# Patient Record
Sex: Female | Born: 2000 | Race: Black or African American | Hispanic: No | Marital: Single | Smoking: Never smoker
Health system: Southern US, Community
[De-identification: ages and names within clinical notes are randomized; demographics above are authoritative.]

## PROBLEM LIST (undated history)

## (undated) DIAGNOSIS — Z789 Other specified health status: Secondary | ICD-10-CM

## (undated) DIAGNOSIS — J45909 Unspecified asthma, uncomplicated: Secondary | ICD-10-CM

## (undated) HISTORY — DX: Other specified health status: Z78.9

## (undated) HISTORY — PX: NO PAST SURGERIES: SHX2092

---

## 2020-10-09 NOTE — L&D Delivery Note (Signed)
Operative Delivery Note  Indication for operative vaginal delivery: Prolonged fetal heart rate deceleration, maternal exhaustion in second stage of labor  Patient was examined and found to be fully dilated with fetal station of +2.  Patient's bladder was noted to be empty, and there were no known fetal contraindications to operative vaginal delivery. EFW was 8 lbs by Le[place]olds.  Risks of vacuum assistance were discussed in detail, including but not limited to, bleeding, infection, damage to maternal tissues, fetal cephalohematoma, inability to effect vaginal delivery of the head or shoulder dystocia that cannot be resolved by established maneuvers and need for emergency cesarean section.  Patient gave verbal consent.  The soft vacuum cup was positioned over the sagittal suture 3 cm anterior to posterior fontanelle.  Pressure was then increased to 500 mmHg, and the patient was instructed to push.  Pulling was administered along the pelvic curve while patient was pushing; there were 2 contractions and no popoffs.  Vacuum was reduced in between contractions.  The infant's head was then delivered  and two nuchal cords were noted and reduced. Shoulder dystocia was noted, this was alleviated with McRoberts' positioning and suprapubic pressure, but lasted for 30 seconds.  Female infant was delivered, noted to be without tone, cord immediately clamped and cut and he was handed to awaiting Neonatology team for resuscitation.  Apgars were 0, 7, 9; arterial cord pH was 7.12.  There was spontaneous placental delivery, intact with three-vessel cord.  Second degree perineal laceration noted requiring repair with 3-0 Vicryl in the usual fashion.  EBL 500 ml, from atony, Methergine x 1 dose and TXA given.  She had epidural anesthesia.   Sponge, instrument and needle counts were correct x 3.  Baby was taken to NICU. The patient was stable after delivery with plans to transfer later to postpartum unit    Jaynie Collins, MD,  FACOG Obstetrician & Gynecologist, [facility] for [employer], [facility]

## 2021-02-14 ENCOUNTER — Other Ambulatory Visit: Payer: Self-pay

## 2021-02-14 ENCOUNTER — Inpatient Hospital Stay (HOSPITAL_COMMUNITY)
Admission: AD | Admit: 2021-02-14 | Discharge: 2021-02-14 | Disposition: A | Discharge status: Home / Self Care | Payer: Medicaid Other | Attending: Family Medicine | Admitting: Family Medicine

## 2021-02-14 ENCOUNTER — Encounter (HOSPITAL_COMMUNITY): Payer: Self-pay | Admitting: Family Medicine

## 2021-02-14 ENCOUNTER — Inpatient Hospital Stay (HOSPITAL_COMMUNITY): Payer: Medicaid Other

## 2021-02-14 DIAGNOSIS — R109 Unspecified abdominal pain: Secondary | ICD-10-CM

## 2021-02-14 DIAGNOSIS — E876 Hypokalemia: Secondary | ICD-10-CM | POA: Insufficient documentation

## 2021-02-14 DIAGNOSIS — R1030 Lower abdominal pain, unspecified: Secondary | ICD-10-CM | POA: Insufficient documentation

## 2021-02-14 DIAGNOSIS — O99891 Other specified diseases and conditions complicating pregnancy: Secondary | ICD-10-CM

## 2021-02-14 DIAGNOSIS — O26891 Other specified pregnancy related conditions, first trimester: Secondary | ICD-10-CM | POA: Insufficient documentation

## 2021-02-14 DIAGNOSIS — O99281 Endocrine, nutritional and metabolic diseases complicating pregnancy, first trimester: Secondary | ICD-10-CM | POA: Diagnosis not present

## 2021-02-14 DIAGNOSIS — Z3A10 10 weeks gestation of pregnancy: Secondary | ICD-10-CM | POA: Diagnosis not present

## 2021-02-14 DIAGNOSIS — Z3490 Encounter for supervision of normal pregnancy, unspecified, unspecified trimester: Secondary | ICD-10-CM

## 2021-02-14 HISTORY — DX: Unspecified asthma, uncomplicated: J45.909

## 2021-02-14 LAB — COMPREHENSIVE METABOLIC PANEL
ALT: 9 U/L (ref 0–44)
AST: 14 U/L — ABNORMAL LOW (ref 15–41)
Albumin: 3.9 g/dL (ref 3.5–5.0)
Alkaline Phosphatase: 46 U/L (ref 38–126)
Anion gap: 6 (ref 5–15)
BUN: <5 mg/dL — ABNORMAL LOW (ref 6–20)
CO2: 26 mmol/L (ref 22–32)
Calcium: 9.3 mg/dL (ref 8.9–10.3)
Chloride: 101 mmol/L (ref 98–111)
Creatinine, Ser: 0.59 mg/dL (ref 0.44–1.00)
GFR, Estimated: >60 mL/min (ref >60)
Glucose, Bld: 89 mg/dL (ref 70–99)
Potassium: 2.8 mmol/L — ABNORMAL LOW (ref 3.5–5.1)
Sodium: 133 mmol/L — ABNORMAL LOW (ref 135–145)
Total Bilirubin: 0.6 mg/dL (ref 0.3–1.2)
Total Protein: 8.1 g/dL (ref 6.5–8.1)

## 2021-02-14 LAB — URINALYSIS, ROUTINE W REFLEX MICROSCOPIC
Bilirubin Urine: NEGATIVE
Glucose, UA: NEGATIVE mg/dL
Hgb urine dipstick: NEGATIVE
Ketones, ur: 20 mg/dL — AB
Leukocytes,Ua: NEGATIVE
Nitrite: NEGATIVE
Protein, ur: NEGATIVE mg/dL
Specific Gravity, Urine: 1.006 (ref 1.005–1.030)
pH: 7 (ref 5.0–8.0)

## 2021-02-14 LAB — CBC
HCT: 36.6 % (ref 36.0–46.0)
Hemoglobin: 12 g/dL (ref 12.0–15.0)
MCH: 30.8 pg (ref 26.0–34.0)
MCHC: 32.8 g/dL (ref 30.0–36.0)
MCV: 94.1 fL (ref 80.0–100.0)
Platelets: 308 10*3/uL (ref 150–400)
RBC: 3.89 MIL/uL (ref 3.87–5.11)
RDW: 17 % — ABNORMAL HIGH (ref 11.5–15.5)
WBC: 3.8 10*3/uL — ABNORMAL LOW (ref 4.0–10.5)
nRBC: 0 % (ref 0.0–0.2)

## 2021-02-14 LAB — I-STAT BETA HCG BLOOD, ED (MC, WL, AP ONLY): I-stat hCG, quantitative: >2000 m[IU]/mL — ABNORMAL HIGH (ref <5)

## 2021-02-14 LAB — LIPASE, BLOOD: Lipase: 23 U/L (ref 11–51)

## 2021-02-14 LAB — HCG, QUANTITATIVE, PREGNANCY: hCG, Beta Chain, Quant, S: 269665 m[IU]/mL — ABNORMAL HIGH (ref <5)

## 2021-02-14 IMAGING — US US OB COMP LESS 14 WK
1 series · 15 of 28 positions shown · non-contrast
Comparison: None.

CLINICAL DATA: Abdominal pain. Estimated gestational age by last
menstrual period equals 10 weeks 0 days

EXAM:
OBSTETRIC <14 WK ULTRASOUND
TECHNIQUE: Transabdominal ultrasound was performed for evaluation of the
gestation as well as the maternal uterus and adnexal regions.

[Series 1: us ob comp less 14 wk · 15 of 31 slices shown]
[im 1/31]
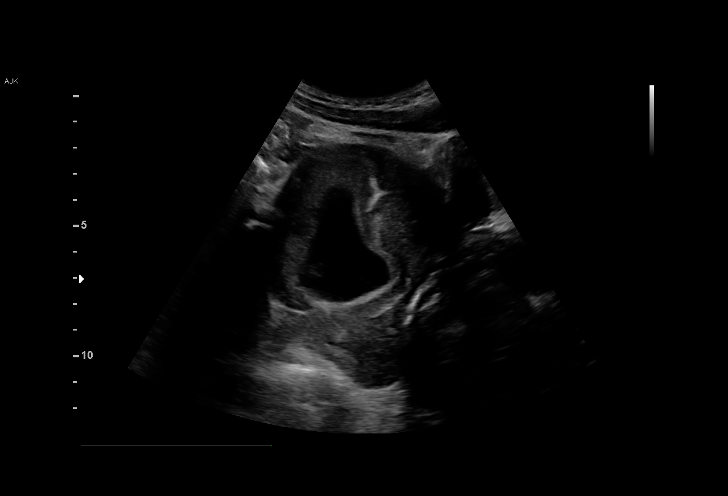
[im 3/31]
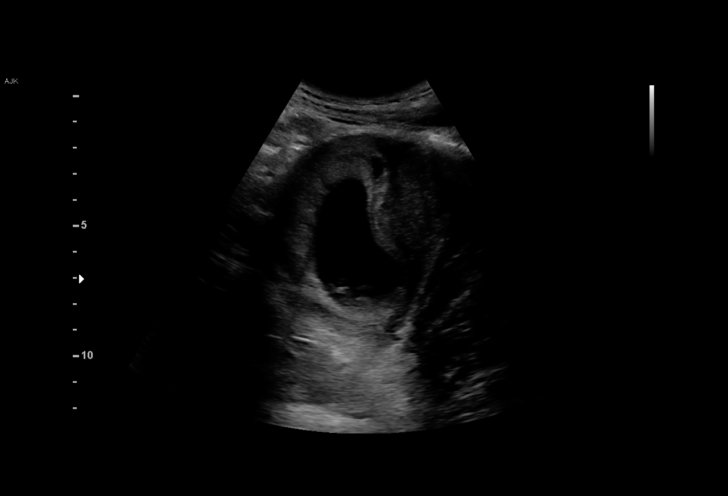
[im 5/31]
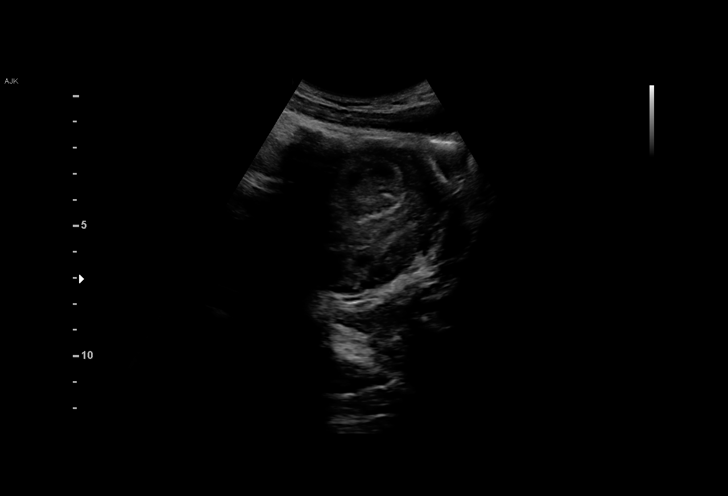
[im 7/31]
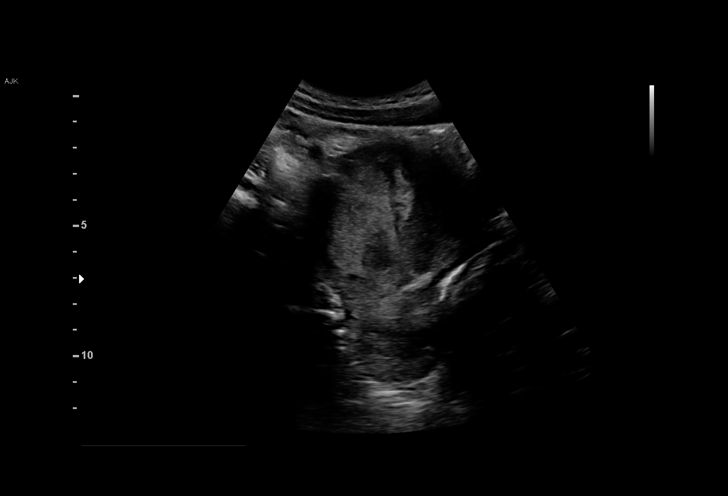
[im 9/31]
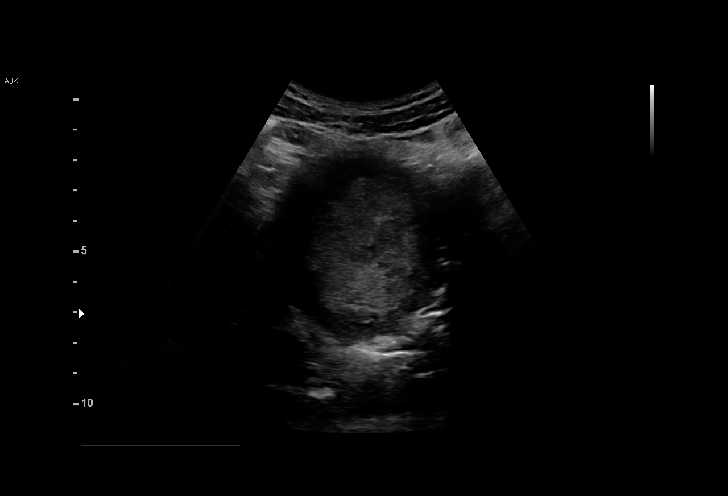
[im 12/31]
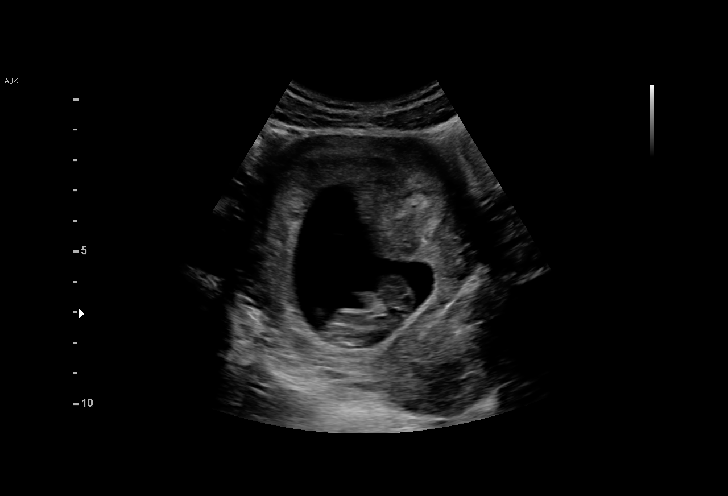
[im 14/31]
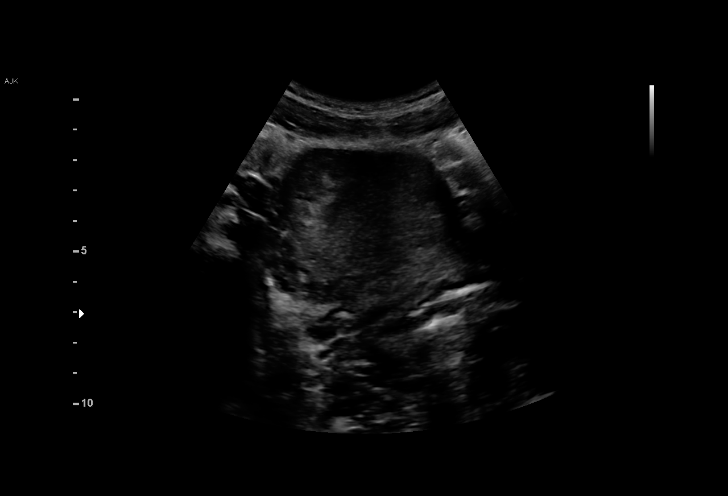
[im 16/31]
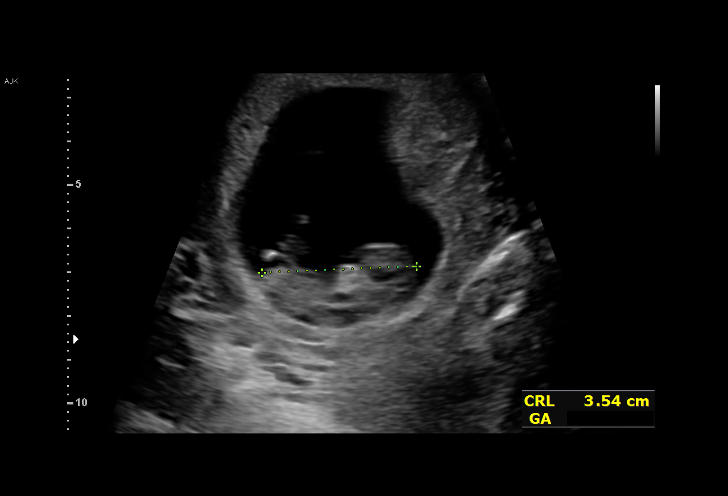
[im 17/31]
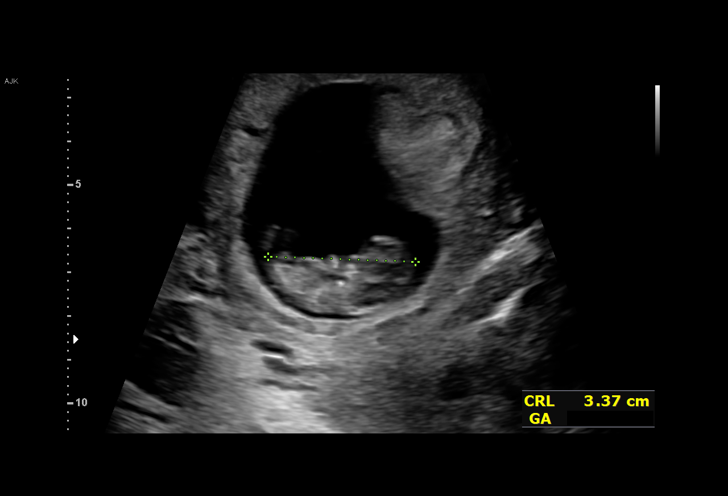
[im 19/31]
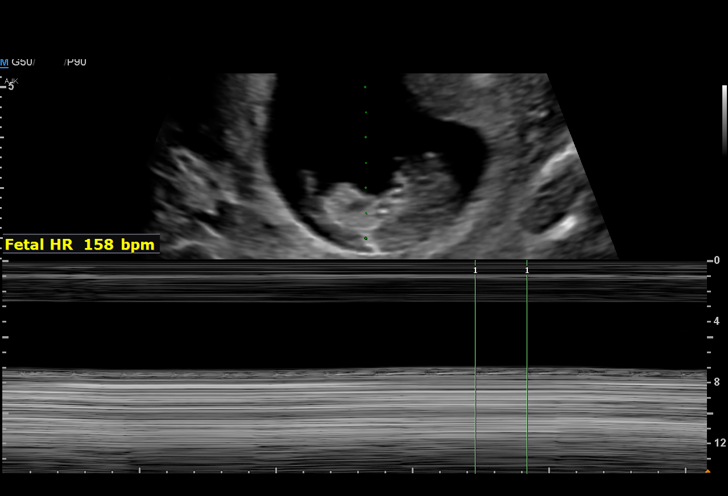
[im 22/31]
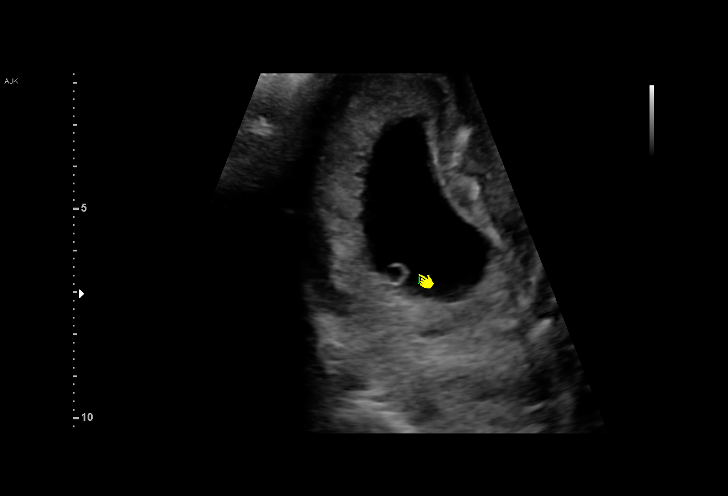
[im 24/31]
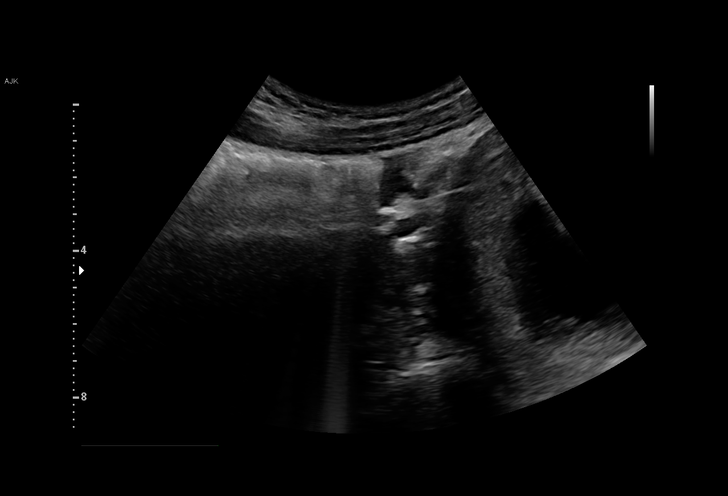
[im 26/31]
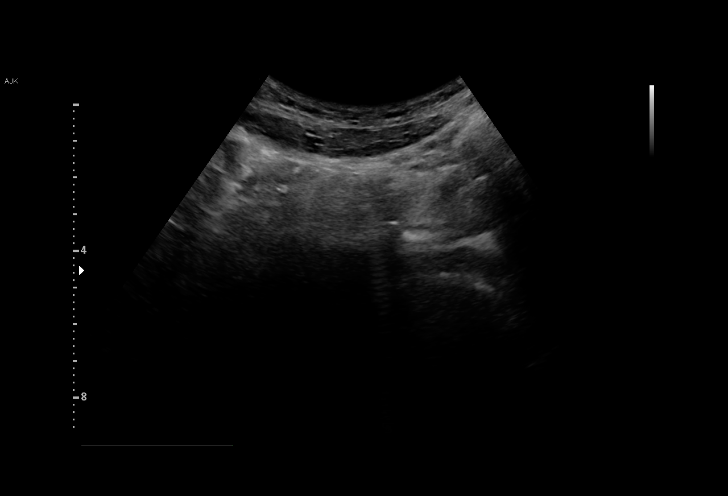
[im 28/31]
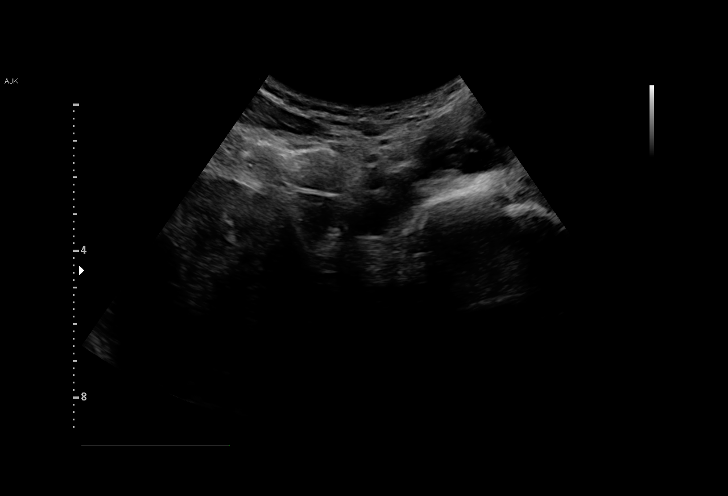
[im 31/31]
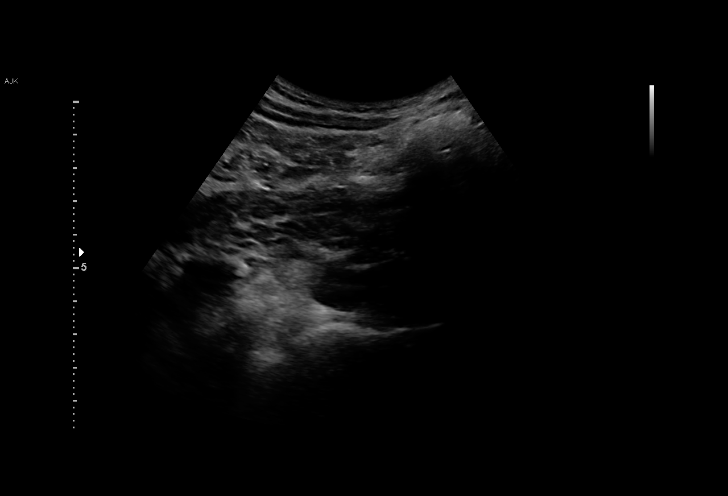

[15 of 28 positions shown; findings below may reference images not displayed]

FINDINGS: Intrauterine gestational sac: Present

Yolk sac:  Present

Embryo:  Present

Cardiac Activity: Present

Heart Rate: 158 bpm

CRL:   33.8 mm   10 w 1 d                  US EDC: [DATE]

Subchorionic hemorrhage:  None visualized.

Maternal uterus/adnexae: Ovaries not well identified on
transabdominal exam. No free fluid
IMPRESSION: 1. Single intrauterine gestation with embryo and normal cardiac
activity.

2. Estimated gestational age by crown rump length equals 10 weeks 1
day.

## 2021-02-14 MED ORDER — POTASSIUM CHLORIDE CRYS ER 20 MEQ PO TBCR
40.0000 meq | EXTENDED_RELEASE_TABLET | Freq: Once | ORAL | Status: AC
  Administered 2021-02-14: 40 meq via ORAL
  Filled 2021-02-14: qty 2

## 2021-02-14 MED ORDER — PYRIDOXINE HCL 25 MG PO TABS: 25.0000 mg | ORAL_TABLET | Freq: Four times a day (QID) | ORAL | 3 refills | Status: DC | PRN

## 2021-02-14 MED ORDER — PREPLUS 27-1 MG PO TABS: 1.0000 | ORAL_TABLET | Freq: Every day | ORAL | 13 refills | Status: AC

## 2021-02-14 MED ORDER — DOXYLAMINE SUCCINATE (SLEEP) 25 MG PO TABS: 25.0000 mg | ORAL_TABLET | Freq: Four times a day (QID) | ORAL | 2 refills | Status: DC | PRN

## 2021-02-14 NOTE — ED Provider Notes (Signed)
Emergency Medicine Provider Triage Evaluation Note  Nichole Bauer , a 20 y.o. female  was evaluated in triage.  Pt complains of lower abd pain/cramping.  No vaginal bleeding.  No discharge.  N/V.  Denies marijuana or alcohol use.  Denies fevers but endorses hot flashes.  Feels fatigued.  Review of Systems  Positive: Lower abd cramping. N/V. Fatigued. Negative: Fevers. Vaginal bleeding/discharge.  Physical Exam  BP (!) 131/101 (BP Location: Right Arm)   Pulse 71   Temp 98.8 F (37.1 C) (Oral)   Resp 16   Ht 4\' 10"  (1.473 m)   Wt 52.2 kg   SpO2 100%   BMI 24.04 kg/m  Gen:   Awake, no distress   Resp:  Normal effort  MSK:   Moves extremities without difficulty    Medical Decision Making  Medically screening exam initiated at 10:48 AM.  Appropriate orders placed.  Nichole Bauer was informed that the remainder of the evaluation will be completed by another provider, this initial triage assessment does not replace that evaluation, and the importance of remaining in the ED until their evaluation is complete.    , PA-C 02/14/21 1050    04/16/21, MD 02/14/21 1254

## 2021-02-14 NOTE — ED Provider Notes (Signed)
Patient was screened earlier in triage, found to have a positive hCG at greater than 2000.  Called to MAU APP who accepts patient in transfer to MAU.  Plan is to give K-Dur p.o. for her potassium of 2.8 prior to transfer.  RN updated with plan of care.   Jeannie Fend, PA-C 02/14/21 1218    Bethann Berkshire, MD 02/14/21 1254

## 2021-02-14 NOTE — MAU Provider Note (Signed)
History     CSN: [id number]  Arrival date and time: 02/14/21 1012   None     Chief Complaint  Patient presents with  . Abdominal Pain   HPI Patient presents as a transfer from the ER for abdominal cramping and a positive pregnancy test. Based on her LMP she would be approximately ten weeks pregnant.   She also was noted to be hypokalemic with a K of 2.8. She was given PO potassium prior to arrival in MAU.   Patient reports vomiting and abdominal cramping for the past month. She has had difficulty tolerating PO, however today she reports that she has zero nausea and feels fine. She has tolerated PO today but just felt that it was important to come get checked out.   She reports abdominal pain comes and goes, at worst is rated 5/10. She denies any vaginal bleeding. No prior history of pregnancy. This pregnancy was not planned, however she thinks she will keep the baby.   No meds, no hx of hospitalization.   Just recently moved from [place]. Staying with sister/two kids. Feels safe at home.  Endorses Marijuana use a few days ago. Alcohol use > one month ago. No other drug use.      OB History    Gravida  1   Para      Term      Preterm      AB      Living        SAB      IAB      Ectopic      Multiple      Live Births              Social History   Tobacco Use  . Smoking status: Never Smoker  . Smokeless tobacco: Never Used  Substance Use Topics  . Alcohol use: Not Currently  . Drug use: Never    Allergies: No Known Allergies  No medications prior to admission.    Review of Systems  Constitutional: Negative for activity change, appetite change and chills.  Respiratory: Negative for shortness of breath.   Cardiovascular: Negative for chest pain.  Gastrointestinal: Positive for abdominal pain. Negative for abdominal distention and constipation.  Genitourinary: Negative for difficulty urinating, dysuria, frequency and vaginal discharge.   Neurological: Negative for dizziness.   Physical Exam   Blood pressure 124/81, pulse 62, temperature 98.7 F (37.1 C), temperature source Oral, resp. rate 15, height 4\' 10"  (1.473 m), weight 52.2 kg, last menstrual period 12/06/2020, SpO2 100 %.  Physical Exam Vitals and nursing note reviewed.  Constitutional:      Appearance: She is well-developed.  HENT:     Head: Normocephalic and atraumatic.  Abdominal:     General: There is no distension.     Palpations: Abdomen is soft.     Tenderness: There is generalized abdominal tenderness.     Comments: Tenderness to palpation of lower quadrants bilaterally, no rebound or guarding   Skin:    General: Skin is warm and dry.  Neurological:     Mental Status: She is alert.  Psychiatric:        Mood and Affect: Mood normal.      MAU Course  Procedures  MDM  -sent from ER, reviewed labs performed in ER -UA, quant HCG, 12/08/2020 ordered -UA normal, HCG pending -US shows viable IUP at [redacted]w[redacted]d  Assessment and Plan   Viable IUP at [redacted]w[redacted]d -script sent for PNV, b6/doxylamine  -  provided with list of OB providers in area -stable for dc home   Gita Kudo 02/14/2021, 2:23 PM

## 2021-02-14 NOTE — MAU Note (Signed)
.  Nichole Bauer is a 20 y.o. at approximately [redacted]w[redacted]d here in MAU reporting: She has been throwing up for the past month. Patient states she has been vomiting for the past month off and on. She states some days she is able to eat and others she can only take a few bites before feeling like she is going to throw up. Last vomiting episode yesterday while eating fruit. She states she was able to eat the day before without any complications. Denies any pain or nausea today but states she had generalized abdominal pain yesterday evening prior to a bowel movement which was hard to pass. No VB or LOF. Denies any abnormal discharge.  K+ level of 2.8. Given 40 mEq in the ED. Patient tolerated two pills and water well.   Patient states, "I feel well today, I just wanted to get checked out and be sure the baby is okay."  FHT doppler: 161 LMP: 12/06/2020 Pain score: 0/10 Lab orders placed from triage:  UA

## 2021-02-14 NOTE — ED Triage Notes (Signed)
Pt presents with vomiting with specks of blood since yesterday. Pt states her LMP was the end of Feb. Has a cramping sensation to the lower abdomen too.

## 2021-04-01 ENCOUNTER — Inpatient Hospital Stay (HOSPITAL_COMMUNITY)
Admission: AD | Admit: 2021-04-01 | Discharge: 2021-04-01 | Disposition: A | Discharge status: Home / Self Care | Payer: Medicaid Other | Attending: Obstetrics & Gynecology | Admitting: Obstetrics & Gynecology

## 2021-04-01 ENCOUNTER — Encounter (HOSPITAL_COMMUNITY): Payer: Self-pay | Admitting: Obstetrics & Gynecology

## 2021-04-01 ENCOUNTER — Inpatient Hospital Stay (HOSPITAL_BASED_OUTPATIENT_CLINIC_OR_DEPARTMENT_OTHER): Payer: Medicaid Other

## 2021-04-01 DIAGNOSIS — Z3A16 16 weeks gestation of pregnancy: Secondary | ICD-10-CM

## 2021-04-01 DIAGNOSIS — N898 Other specified noninflammatory disorders of vagina: Secondary | ICD-10-CM

## 2021-04-01 DIAGNOSIS — O99612 Diseases of the digestive system complicating pregnancy, second trimester: Secondary | ICD-10-CM | POA: Insufficient documentation

## 2021-04-01 DIAGNOSIS — O26892 Other specified pregnancy related conditions, second trimester: Secondary | ICD-10-CM | POA: Diagnosis not present

## 2021-04-01 DIAGNOSIS — O4692 Antepartum hemorrhage, unspecified, second trimester: Secondary | ICD-10-CM | POA: Diagnosis not present

## 2021-04-01 DIAGNOSIS — K5900 Constipation, unspecified: Secondary | ICD-10-CM | POA: Insufficient documentation

## 2021-04-01 DIAGNOSIS — O26852 Spotting complicating pregnancy, second trimester: Secondary | ICD-10-CM

## 2021-04-01 LAB — WET PREP, GENITAL
Clue Cells Wet Prep HPF POC: NONE SEEN
Sperm: NONE SEEN
Trich, Wet Prep: NONE SEEN
WBC, Wet Prep HPF POC: NONE SEEN
Yeast Wet Prep HPF POC: NONE SEEN

## 2021-04-01 NOTE — Discharge Instructions (Signed)
Return to MAU:  If you have heavier bleeding that soaks through more that 2 pads per hour for an hour or more  If you bleed so much that you feel like you might pass out or you do pass out  If you have significant abdominal pain that is not improved with Tylenol 1000 mg every 6 hours as needed for pain  If you develop a fever > 100.5   

## 2021-04-01 NOTE — MAU Provider Note (Signed)
History     CSN: [id number]  Arrival date and time: 04/01/21 1225   Event Date/Time   First Provider Initiated Contact with Patient 04/01/21 1350      Chief Complaint  Patient presents with   Vaginal Bleeding   Rectal Bleeding   Nichole Bauer is a 20 y.o. year old G1P0 female at [redacted]w[redacted]d weeks gestation who presents to MAU reporting constipation and she felt like she ripped while having a BM. She saw bleeding a few days ago, but still bleeding a little bit day by day. She is not sure if the bleeding is rectal or vaginal. She now reports brown d/c like at the end of her period. She was concerned that she is having no pain with spotting. She will start [facility] with [facility] on 04/20/2021. Her sister is present and contributing to the history taking.   OB History     Gravida  1   Para      Term      Preterm      AB      Living         SAB      IAB      Ectopic      Multiple      Live Births              Past Medical History:  Diagnosis Date   Asthma     Past Surgical History:  Procedure Laterality Date   NO PAST SURGERIES      History reviewed. No pertinent family history.  Social History   Tobacco Use   Smoking status: Never   Smokeless tobacco: Never  Vaping Use   Vaping Use: Never used  Substance Use Topics   Alcohol use: Not Currently   Drug use: Never    Allergies: No Known Allergies  Medications Prior to Admission  Medication Sig Dispense Refill Last Dose   Prenatal Vit-Fe Fumarate-FA (PREPLUS) 27-1 MG TABS Take 1 tablet by mouth daily. 30 tablet 13 04/01/2021 at 0830   doxylamine, Sleep, (UNISOM) 25 MG tablet Take 1 tablet (25 mg total) by mouth 4 (four) times daily as needed (nausea and vomiting). 30 tablet 2    pyridOXINE (VITAMIN B-6) 25 MG tablet Take 1 tablet (25 mg total) by mouth 4 (four) times daily as needed (nausea and vomiting). 30 tablet 3     Review of Systems  Constitutional: Negative.   HENT: Negative.    Eyes:  Negative.   Respiratory: Negative.    Cardiovascular: Negative.   Gastrointestinal:  Positive for constipation.  Endocrine: Negative.   Genitourinary:  Positive for vaginal bleeding.  Musculoskeletal: Negative.   Skin: Negative.   Allergic/Immunologic: Negative.   Neurological: Negative.   Hematological: Negative.   Psychiatric/Behavioral: Negative.    Physical Exam   Blood pressure 117/64, pulse 78, temperature 98.3 F (36.8 C), temperature source Oral, resp. rate 20, height 4\' 10"  (1.473 m), weight 50.3 kg, last menstrual period 12/06/2020, SpO2 100 %.  Physical Exam Vitals and nursing note reviewed. Exam conducted with a chaperone present.  Constitutional:      Appearance: Normal appearance. She is normal weight.  Cardiovascular:     Rate and Rhythm: Normal rate.  Pulmonary:     Effort: Pulmonary effort is normal.  Abdominal:     General: Abdomen is flat.     Palpations: Abdomen is soft.  Genitourinary:    General: Normal vulva.     Comments: Pelvic exam: External genitalia  normal, SE: vaginal walls pink and well rugated, cervix is smooth, pink, no lesions, small amt of thick, light brown vaginal d/c -- WP, GC/CT done, cervix visually closed, Uterus is non-tender, S=D, no CMT or friability, no adnexal tenderness.  Musculoskeletal:        General: Normal range of motion.  Skin:    General: Skin is warm and dry.  Neurological:     Mental Status: She is alert and oriented to person, place, and time.  Psychiatric:        Mood and Affect: Mood normal.        Behavior: Behavior normal.        Thought Content: Thought content normal.        Judgment: Judgment normal.   FHTs by doppler: 152 bpm  MAU Course  Procedures  MDM Wet Prep GC/CT -- Results pending   Results for orders placed or performed during the hospital encounter of 04/01/21 (from the past 24 hour(s))  Wet prep, genital     Status: None   Collection Time: 04/01/21  2:13 PM   Specimen: PATH Cytology  Cervicovaginal Ancillary Only  Result Value Ref Range   Yeast Wet Prep HPF POC NONE SEEN NONE SEEN   Trich, Wet Prep NONE SEEN NONE SEEN   Clue Cells Wet Prep HPF POC NONE SEEN NONE SEEN   WBC, Wet Prep HPF POC NONE SEEN NONE SEEN   Sperm NONE SEEN         Assessment and Plan  Spotting affecting pregnancy in second trimester  - Return to MAU: If you have heavier bleeding that soaks through more that 2 pads per hour for an hour or more If you bleed so much that you feel like you might pass out or you do pass out If you have significant abdominal pain that is not improved with Tylenol 1000 mg every 6 hours as needed for pain If you develop a fever > 100.5   [redacted] weeks gestation of pregnancy - Information provided on constipation and eating a high fiber diet   - Discharge patient - Keep scheduled appt with [facility] on 7/13 - Patient verbalized an understanding of the plan of care and agrees.    Raelyn Mora, CNM 04/01/2021, 2:06 PM

## 2021-04-01 NOTE — MAU Note (Signed)
"  Started having problems with behind, has been constipated, when she made a bowel movement, it felt like it ripped., saw bleeding (this was a few days ago) has still been bleeding a little bit, day be day. " Yesterday she had some spotting, wasn't sure if it was from the front or back, seemed to be from both places.  Today she saw brown d/c like at end of her period.  No pain.

## 2021-04-04 LAB — GC/CHLAMYDIA PROBE AMP (~~LOC~~) NOT AT ARMC
Chlamydia: POSITIVE — AB
Comment: NEGATIVE
Comment: NORMAL
Neisseria Gonorrhea: NEGATIVE

## 2021-04-06 ENCOUNTER — Telehealth (INDEPENDENT_AMBULATORY_CARE_PROVIDER_SITE_OTHER): Payer: Medicaid Other

## 2021-04-06 DIAGNOSIS — O98319 Other infections with a predominantly sexual mode of transmission complicating pregnancy, unspecified trimester: Secondary | ICD-10-CM

## 2021-04-06 DIAGNOSIS — Z136 Encounter for screening for cardiovascular disorders: Secondary | ICD-10-CM

## 2021-04-06 DIAGNOSIS — Z3A00 Weeks of gestation of pregnancy not specified: Secondary | ICD-10-CM

## 2021-04-06 DIAGNOSIS — Z3480 Encounter for supervision of other normal pregnancy, unspecified trimester: Secondary | ICD-10-CM | POA: Insufficient documentation

## 2021-04-06 DIAGNOSIS — A749 Chlamydial infection, unspecified: Secondary | ICD-10-CM

## 2021-04-06 MED ORDER — BLOOD PRESSURE MONITORING DEVI
1.0000 | 0 refills | Status: DC
Start: 2021-04-06 — End: 2022-01-23

## 2021-04-06 MED ORDER — AZITHROMYCIN 250 MG PO TABS: 1000.0000 mg | ORAL_TABLET | Freq: Once | ORAL | 0 refills | Status: AC

## 2021-04-06 NOTE — Progress Notes (Signed)
Pt tested + Chlamydia, sent Rx Azithromycin to Pharmacy on file. Pt states has not had any type of symptoms.Advised that she could have had iut for a while & didn't know.Advised to contact last partner to get tested & Treated.Pt verbalized understanding.

## 2021-04-06 NOTE — Progress Notes (Signed)
New OB Intake  I connected with  Nichole Bauer on 04/06/21 at  2:15 PM EDT by MyChart Video Visit and verified that I am speaking with the correct person using two identifiers. Nurse is located at [facility] and pt is located at HOME.  I discussed the limitations, risks, security and privacy concerns of performing an evaluation and management service by telephone and the availability of in person appointments. I also discussed with the patient that there may be a patient responsible charge related to this service. The patient expressed understanding and agreed to proceed.  I explained I am completing New OB Intake today. We discussed her EDD of 09/12/22 that is based on LMP of 12/06/20. Pt is G1/P0. I reviewed her allergies, medications, Medical/Surgical/OB history, and appropriate screenings. I informed her of [facility] services. Based on history, this is a/an  pregnancy uncomplicated .   There are no problems to display for this patient.   Concerns addressed today  Delivery Plans:  Plans to deliver at [facility].   MyChart/Babyscripts MyChart access verified. I explained pt will have some visits in office and some virtually. Babyscripts instructions given and order placed. Patient verifies receipt of registration text/e-mail. Account successfully created and app downloaded.  Blood Pressure Cuff  Blood pressure cuff ordered for patient to pick-up from [employer]. Explained after first prenatal appt pt will check weekly and document in Babyscripts.  Weight scale: Patient    have weight scale. Weight scale ordered   Anatomy US Explained first scheduled [place] will be around 19 weeks. Anatomy US scheduled for 04/29/21 at 09:30 a. Pt notified to arrive at 09:15a.  Labs Discussed Avelina Laine genetic screening with patient. Would like both Panorama and Horizon drawn at new OB visit. Routine prenatal labs needed.  Covid Vaccine Patient has not covid vaccine.   Mother/ Baby Dyad Candidate?    If  yes, offer as possibility  Inform patient of [facility] and place . In AVS   Social Determinants of Health Food Insecurity: Patient denies food insecurity. WIC Referral: Patient is interested in referral to [facility].  Transportation: Patient denies transportation needs. Childcare: Discussed no children allowed at ultrasound appointments. Offered childcare services; patient declines childcare services at this time.  First visit review I reviewed new OB appt with pt. I explained she will have a pelvic exam, ob bloodwork with genetic screening, and PAP smear. Explained pt will be seen by Dr.Stinson at first visit; encounter routed to appropriate provider. Explained that patient will be seen by pregnancy navigator following visit with provider. [facility] information placed in AVS.   Henrietta Dine, CMA 04/06/2021  2:29 PM

## 2021-04-06 NOTE — Patient Instructions (Signed)
AREA PEDIATRIC/FAMILY PRACTICE PHYSICIANS  [place] ([address]) [facility] Chambliss, MD; Eniola, MD; Hale, MD; Hensel, MD; McDiarmid, MD; McIntyer, MD; Ruqaya Strauss, MD; Walden, MD [address] [place], [place] [address] [phone] Mon-Fri 8:30-12:30, 1:30-5:00 Providers come to see babies at [facility] Accepting Medicaid [facility] providers who accept newborns: Koirala, MD; Morrow, MD; Wolters, MD [address], [place], [place] [address] [phone] Mon-Fri 8:00-5:30 Babies seen by providers at [facility] Does NOT accept Medicaid Please call early in hospitalization for appointment (limited availability)  [facility] Mulberry, MD [address] [place], [place] [address] [phone] Mon, Tue, Thur, Fri 8:30-5:00, Wed 10:00-7:00 (closed 1-2pm) Babies seen by [facility] providers Accepting Medicaid Rubin - Pediatrician Rubin, MD [address], [place], [place] [address] [phone] Mon-Fri 8:30-5:00, Sat 8:30-12:00 Provider comes to see babies at [facility] Accepting Medicaid Must have been referred from current patients or contacted office prior to delivery [facility] ([facility]) Brown, MD; Chandler, MD; Ettefagh, MD; Grant, MD; Lester, MD; McCormick, MD; McQueen, MD; Prose, MD; Simha, MD; Stanley, MD; Stryffeler, NP; Tebben, NP [address], [place], [place] [address] [phone] Mon, Tue, Thur, Fri 8:30-5:30, Wed 9:30-5:30, Sat 8:30-12:30 Babies seen by [facility] providers Accepting Medicaid Only accepting infants of first-time parents or siblings of current patients Hospital discharge coordinator will make follow-up appointment Jack Amos [address], [place], [place]  [address] [phone]   Fax - [phone] [facility] [address], [place], [place]  [address] Phone - [phone]   Fax - [phone] Shilpa Gosrani [address], [place], [place]  [address] [phone]  [place] ([address]) [facility] Bates, MD; Brassfield, MD; Cooper, Cox, MD; MD; Davis, MD; Dovico, MD; Ettefaugh, MD; Little, MD; Lowe, MD; Keiffer, MD; Melvin, MD; Sumner, MD; Williams, MD [address], [place], [place] [address] [phone] Mon-Fri 8:30-5:00 (extended evenings Mon-Thur as needed), Sat-Sun 10:00-1:00 Providers come to see babies at [facility] Accepting Medicaid for families of first-time babies and families with all children in the household age 3 and under. Must register with office prior to making appointment (M-F only). [facility] Henson, NP; Knapp, MD; Lalonde, MD; Tysinger, PA [address] [place], [place] [address] [phone] Mon-Fri 8:00-5:00 Babies seen by providers at [facility] Does NOT accept Medicaid/Commercial Insurance Only [facility] ([facility])  Artis, MD; Barnes, MD; Bratton, MD; Coccaro, MD; Lockett Gardner, MD; Kramer, MD; Marshall, MD; Netherton, MD; Poleto, MD; Skinner, MD [address] [place], [place] [address] [phone] Mon-Fri 8:30-5:30, Sat (Oct.-Mar.) 9:00-1:00 Babies seen by providers at [facility] Accepting Medicaid  [place] ([address]) [facility] Reid, MD; Warner, MD [address], [place],  [address] [phone] Mon-Fri 8:30-5:00, Sat 8:30-12:00 Providers come to see babies at [facility] Does NOT accept Medicaid [facility] Becker, PA; Hagler, MD; Scifres, PA; Sun, MD; Swayne, MD [address], [place],  [address] [phone] Mon-Fri 8:00-5:00 Babies seen by providers at [facility] Does NOT accept Medicaid Only accepting babies of parents who  are patients Please call early in hospitalization for appointment (limited availability)  Pediatricians Clark, MD; Frye, MD; Kelleher, MD; Mack, NP; Miller, MD; O'Keller, MD; Patterson, NP; Pudlo, MD; Puzio, MD; Thomas, MD; Tucker, MD; Twiselton, MD [address], [place], [place] [address] [phone] Mon-Fri 8:00-5:00, Sat 9:00-12:00 Providers come to see babies at [facility] Does NOT accept Medicaid  [place] ([address]) [facility] providers accepting new patients: Brake, NP; Wharton, PA [address], [place], [place] [address] [phone] Mon-Fri 8:00-5:00 Babies seen by providers at [facility] Does NOT accept Medicaid Only accepting babies of parents who are patients Please call early in hospitalization for appointment (limited availability) [facility] Gay, MD; Quinlan, MD [address] [place], [place] [address] [phone] (press [phone] to schedule appointment) Mon-Fri 8:00-5:00 Providers come to see babies at [facility] Does NOT accept Medicaid [facility] Mazer, MD [address] [place], [place] [address] [phone] Mon-Fri 8:30-5:00 (lunch 12:30-1:00), extended hours by appointment only Wed 5:00-6:30 Babies seen by [facility] providers Accepting Medicaid [facility] Banks, MD; Jordan, MD; Koberlein, MD [address], [place], [place] [address] [phone] Mon-Fri 8:00-5:00 Babies seen by [facility] providers Does NOT accept Medicaid [facility] Parker, MD; Hunter, MD; Wallace, DO [address] [place], [place] [address] [phone] Mon-Fri 8:00-5:00 Babies seen by [facility] providers Does NOT accept Medicaid [facility] Brandon, PA; Brecken, PA; Christy, NP; Dees, MD; DeClaire, MD; DeWeese, MD; Hansen, NP; Mills, NP; Parrish, NP; Smoot, NP; Summer, MD; Vapne,  MD [address] [place], [place] [address] [phone] Mon-Fri 8:30-5:00, Sat 10:00-1:00 Providers come to see babies at [facility] Does NOT accept Medicaid Free prenatal information session Tuesdays at 4:45pm [facility] Bouska, MD; Gordon, PA; Jeffery, PA; Weber, PA [address] [place] [address] [phone] Mon-Fri 7:30-5:30 Babies seen by [facility] providers [facility] [address], [place], [place]  [address] [phone]   Fax - [phone]  [place] ([address] & [address]) [facility] [place] [address] [place], [place] [address] [phone] Mon-Thur 8:00-6:00 Providers come to see babies at [facility] Accepting Medicaid [facility] Anderson, NP; Badger, MD; Beal, PA; Spencer, PA [address] [place], [place] [address] [phone] Mon-Thur 7:30-7:30, Fri 7:30-4:30 Babies seen by [facility] providers Accepting Medicaid [facility] Agbuya, MD; Klett, NP; Romgoolam, MD [address], [place], [place] [address] [phone] Mon-Fri 8:30-5:00, Sat 8:30-12:00 Providers come to see babies at [facility] Accepting Medicaid Must have "Meet & Greet" appointment at office prior to delivery [facility] ([facility] McCord, MD; Wallace, MD; Wood, MD [address], [place], [place] [address] [phone] Mon-Wed 8:00-6:00, Thur-Fri 8:00-5:00, Sat 9:00-12:00 Providers come to see babies at [facility] Does NOT accept Medicaid Only accepting siblings of current patients [facility]  [address], [place], [place]  [address] [phone]   Fax - [phone] [facility] [address], [place], [place]  [address] [phone]   Fax -  [phone]  [place] ([address] & [address]) [facility] Cirigliano, DO; Matthews, DO [address] , [place] [address] [phone] Mon-Fri 7:00-5:00 Babies seen by [facility] providers Does NOT accept Medicaid [facility] Briscoe, MD; Howley, PA; Moreira, PA [address], [place], [place] [address] [phone] Mon-Fri 8:00-5:00 Babies seen by [facility] providers Accepting Medicaid [facility] Boyd, MD; Church, PA; Jones, NP; Osborn, PA [address], ,  [address] [phone] Mon-Fri 8:00-5:00 Babies seen by providers at [facility] Accepting Medicaid  [place] ([address]) [facility] [place] Wendling, DO [address] [place], [place] [address] [phone] Mon-Fri 8:00-5:00 Babies seen by [facility] providers Does NOT accept Medicaid Limited availability, please call early in hospitalization to schedule follow-up [facility] Calderon, PA; Cummings, MD; Dillard, MD; Martin, PA; Olson, MD; VanDeven, PA [address], [place], [place] [address] [phone] Mon-Fri 8:30-5:00, Sat 9:00-12:00 Babies seen by providers at [facility] Accepting Medicaid Please register online then schedule online or call office [email or web address]) Hunter, NP; Kumar, MD; Martin Rogers, PA [address], [place], [place] [address] [phone] Mon-Fri 8:00-5:00 Babies seen by providers at [facility] Accepting Medicaid [facility] ([facility]) Maud, MD; Kristi Fleenor, NP; West, MD [address], [place], [place] [address] [phone] Mon-Fri 8:00-5:30, Sat&Sun by appointment (phones open at  8:30) Babies seen by [facility] providers Accepting Medicaid Must be a first-time baby or sibling of current patient [facility] [place]  [address], [place], [place]  [address] [phone]   Fax - [phone]  [place] ([address]) [facility] Brown, PA; Cowen, PA; Rice, MD; Helton, PA; Spry, MD [address] [place], [place] [address] [phone] Mon-Thur 8:00-7:00, Fri 8:00-5:00, Sat 8:00-12:00, Sun 9:00-12:00 Babies seen by [facility] providers Accepting Medicaid [facility] Coe-Goins, MD; Marshall, MD; Pierre-Louis, MD [address], [place], [place] [address] [phone] Mon-Thur 8:00-5:00 Babies seen by providers at [facility] Accepting Medicaid [facility] Bratton, MD; Coe-Goins, MD; Hayes, MD; Lewis, MD; List, MD; Lott, MD; Marshall, MD; Moran, MD; O'Zekiel Torian, MD; Pierre-Louis, MD; Pitonzo, MD; Scholer, MD; Spangle, MD [address] [place], [place] [address] [phone] Mon-Fri 8:00-5:30, Sat (Oct.-Mar.) 9:00-1:00 Babies seen by providers at [facility] Accepting Medicaid Must fill out new patient packet, available online at [email or web address] [facility] ([facility]) Friddle, NP; Harris, NP; Kelly, NP; Logan, MD; Melvin, PA; Poth, MD; Ramadoss, MD; Stanton, NP [address], [place], [place] [address] [phone] Mon-Thur 8:00-5:30, Fri 8:00-5:00 Babies seen by providers at [facility] Accepting Medicaid  [place] ([address]) [facility] Dixon, PA; Freeburn, MD; Pickard, MD; Tapia, PA [address], [place], [place] [address] [phone] Mon-Fri 8:00-5:00 Babies seen by providers at [facility] Accepting Medicaid   [place] ([address]) [facility] [place] Masneri, DO; Meyers, MD; Nelson, PA [address], [place], [place] [address] [phone] Mon-Fri 8:00-5:00 Babies seen by providers at [facility] Does NOT accept Medicaid Limited appointment availability, please call early in hospitalization  [facility] [place] Kunedd, DO; McGowen, MD [address], [place], [place] [address] [phone] Mon-Fri 8:00-5:00 Babies seen by [facility] providers Does NOT accept Medicaid [facility] [place] Cameron, MD; MacDonald, MD; Michaels, PA; Nayak, MD [address] [place] [address], [place], [place] [address] [phone] Mon-Fri 8:00-5:00 After hours clinic ([address] [place],  [address]) [phone] Mon-Fri 5:00-8:00, Sat 12:00-6:00, Sun 10:00-4:00 Babies seen by [facility] providers Accepting Medicaid [facility] [place] [address], [place], [place]  [address] [phone]   Fax - [phone]  [place] ([address]) [facility] [place] [facility] Andy, MD [address], [place], [place] [address] [phone] Mon-Fri 8:00-5:00 Babies seen by [facility] providers Does NOT accept Medicaid [facility] [place] ([facility] [place]) Eksir, MD [address], [place], [place] [address] [phone] Mon-Thur 8:00-7:00, Fri 8:00-5:00, Sat 8:00-12:00 Babies seen by providers at [facility] Accepting Medicaid - but does not have vaccinations in office (must be received elsewhere) Limited availability, please call early in hospitalization  [place] ([address]) [facility]  Charlene Flemming, MD [address], [place] [address] [phone]  Fax [phone]  

## 2021-04-07 ENCOUNTER — Other Ambulatory Visit: Payer: Self-pay | Admitting: Obstetrics & Gynecology

## 2021-04-07 MED ORDER — AZITHROMYCIN 500 MG PO TABS: 1000.0000 mg | ORAL_TABLET | Freq: Once | ORAL | 1 refills | Status: AC

## 2021-04-07 NOTE — Progress Notes (Signed)
Rc'd message from answering service- Rx was not sent to pharmacy.  New Rx sent in

## 2021-04-20 ENCOUNTER — Other Ambulatory Visit (HOSPITAL_COMMUNITY)
Admission: RE | Admit: 2021-04-20 | Discharge: 2021-04-20 | Disposition: A | Discharge status: Home / Self Care | Payer: Medicaid Other | Source: Ambulatory Visit | Attending: Family Medicine | Admitting: Family Medicine

## 2021-04-20 ENCOUNTER — Encounter: Payer: Self-pay | Admitting: *Deleted

## 2021-04-20 ENCOUNTER — Ambulatory Visit (INDEPENDENT_AMBULATORY_CARE_PROVIDER_SITE_OTHER): Payer: Medicaid Other | Admitting: Family Medicine

## 2021-04-20 ENCOUNTER — Other Ambulatory Visit: Payer: Self-pay

## 2021-04-20 ENCOUNTER — Encounter: Payer: Self-pay | Admitting: Family Medicine

## 2021-04-20 VITALS — BP 117/76 | HR 94 | Wt 115.0 lb

## 2021-04-20 DIAGNOSIS — A749 Chlamydial infection, unspecified: Secondary | ICD-10-CM | POA: Insufficient documentation

## 2021-04-20 DIAGNOSIS — Z3480 Encounter for supervision of other normal pregnancy, unspecified trimester: Secondary | ICD-10-CM | POA: Diagnosis not present

## 2021-04-20 DIAGNOSIS — O98812 Other maternal infectious and parasitic diseases complicating pregnancy, second trimester: Secondary | ICD-10-CM

## 2021-04-20 DIAGNOSIS — Z315 Encounter for genetic counseling: Secondary | ICD-10-CM | POA: Diagnosis not present

## 2021-04-20 DIAGNOSIS — Z3481 Encounter for supervision of other normal pregnancy, first trimester: Secondary | ICD-10-CM | POA: Diagnosis not present

## 2021-04-20 NOTE — Progress Notes (Signed)
Subjective:  Nichole Bauer is a G1P0 [redacted]w[redacted]d being seen today for her first obstetrical visit.  Her obstetrical history is significant for  first pregnancy . Patient does intend to breast feed. This is an unplanned pregnancy, but she is excited about it. FOB is not currently involved. Pregnancy history fully reviewed.  Patient reports no complaints.  BP 117/76   Pulse 94   Wt 115 lb (52.2 kg)   LMP 12/06/2020 (Approximate)   BMI 24.04 kg/m   HISTORY: OB History  Gravida Para Term Preterm AB Living  1            SAB IAB Ectopic Multiple Live Births               # Outcome Date GA Lbr Len/2nd Weight Sex Delivery Anes PTL Lv  1 Current             Past Medical History:  Diagnosis Date   Medical history non-contributory     Past Surgical History:  Procedure Laterality Date   NO PAST SURGERIES      History reviewed. No pertinent family history.   Exam  BP 117/76   Pulse 94   Wt 115 lb (52.2 kg)   LMP 12/06/2020 (Approximate)   BMI 24.04 kg/m   Chaperone present during exam  CONSTITUTIONAL: Well-developed, well-nourished female in no acute distress.  HENT:  Normocephalic, atraumatic, External right and left ear normal. Oropharynx is clear and moist EYES: Conjunctivae and EOM are normal. Pupils are equal, round, and reactive to light. No scleral icterus.  NECK: Normal range of motion, supple, no masses.  Normal thyroid.  CARDIOVASCULAR: Normal heart rate noted, regular rhythm RESPIRATORY: Clear to auscultation bilaterally. Effort and breath sounds normal, no problems with respiration noted. BREASTS: not done ABDOMEN: Soft, normal bowel sounds, no distention noted.  No tenderness, rebound or guarding.  PELVIC: Normal appearing external genitalia; normal appearing vaginal mucosa and cervix. No abnormal discharge noted. Normal uterine size, no other palpable masses, no uterine or adnexal tenderness. MUSCULOSKELETAL: Normal range of motion. No tenderness.  No  cyanosis, clubbing, or edema.  2+ distal pulses. SKIN: Skin is warm and dry. No rash noted. Not diaphoretic. No erythema. No pallor. NEUROLOGIC: Alert and oriented to person, place, and time. Normal reflexes, muscle tone coordination. No cranial nerve deficit noted. PSYCHIATRIC: Normal mood and affect. Normal behavior. Normal judgment and thought content.    Assessment:    Pregnancy: G1P0 Patient Active Problem List   Diagnosis Date Noted   Chlamydia infection affecting pregnancy in second trimester 04/20/2021   Supervision of other normal pregnancy, antepartum 04/06/2021      Plan:   1. Supervision of other normal pregnancy, antepartum FHT and FH normal - Genetic Screening - CBC/D/Plt+RPR+Rh+ABO+RubIgG... - Culture, OB Urine - Cytology - PAP( [place]) - Hemoglobin A1c  2. Chlamydia infection affecting pregnancy in second trimester S/p treatment. Needs TOC next appointment    Initial labs obtained Continue prenatal vitamins Reviewed n/v relief measures and warning s/s to report Reviewed recommended weight gain based on pre-gravid BMI Encouraged well-balanced diet Genetic & carrier screening discussed: requests Panorama, requests Horizon  Ultrasound discussed; fetal survey: requested CCNC completed> form faxed if has or is planning to apply for medicaid The nature of [name] [employer] with multiple MDs and other Advanced Practice Providers was explained to patient; also emphasized that fellows, residents, and students are part of our team.  Problem list reviewed and updated. 75% of 30  min visit spent on counseling and coordination of care.     Levie Heritage 04/20/2021

## 2021-04-21 LAB — HEMOGLOBIN A1C
Est. average glucose Bld gHb Est-mCnc: 103 mg/dL
Hgb A1c MFr Bld: 5.2 % (ref 4.8–5.6)

## 2021-04-21 LAB — CBC/D/PLT+RPR+RH+ABO+RUBIGG...
Antibody Screen: NEGATIVE
Basophils Absolute: 0 10*3/uL (ref 0.0–0.2)
Basos: 0 %
EOS (ABSOLUTE): 0.1 10*3/uL (ref 0.0–0.4)
Eos: 1 %
HCV Ab: 0.1 s/co ratio (ref 0.0–0.9)
HIV Screen 4th Generation wRfx: NONREACTIVE
Hematocrit: 32.4 % — ABNORMAL LOW (ref 34.0–46.6)
Hemoglobin: 10.7 g/dL — ABNORMAL LOW (ref 11.1–15.9)
Hepatitis B Surface Ag: NEGATIVE
Immature Grans (Abs): 0 10*3/uL (ref 0.0–0.1)
Immature Granulocytes: 1 %
Lymphocytes Absolute: 1.2 10*3/uL (ref 0.7–3.1)
Lymphs: 14 %
MCH: 31.4 pg (ref 26.6–33.0)
MCHC: 33 g/dL (ref 31.5–35.7)
MCV: 95 fL (ref 79–97)
Monocytes Absolute: 1 10*3/uL — ABNORMAL HIGH (ref 0.1–0.9)
Monocytes: 12 %
Neutrophils Absolute: 5.9 10*3/uL (ref 1.4–7.0)
Neutrophils: 72 %
Platelets: 267 10*3/uL (ref 150–450)
RBC: 3.41 x10E6/uL — ABNORMAL LOW (ref 3.77–5.28)
RDW: 14.5 % (ref 11.7–15.4)
RPR Ser Ql: NONREACTIVE
Rh Factor: POSITIVE
Rubella Antibodies, IGG: 7.58 index (ref >0.99)
WBC: 8.2 10*3/uL (ref 3.4–10.8)

## 2021-04-21 LAB — HCV INTERPRETATION

## 2021-04-22 LAB — URINE CULTURE, OB REFLEX

## 2021-04-22 LAB — CULTURE, OB URINE

## 2021-04-27 LAB — CYTOLOGY - PAP
Comment: NEGATIVE
Diagnosis: UNDETERMINED — AB
High risk HPV: NEGATIVE

## 2021-04-29 ENCOUNTER — Other Ambulatory Visit: Payer: Self-pay

## 2021-04-29 ENCOUNTER — Ambulatory Visit: Payer: Medicaid Other | Attending: Family Medicine

## 2021-04-29 ENCOUNTER — Other Ambulatory Visit: Payer: Self-pay | Admitting: *Deleted

## 2021-04-29 DIAGNOSIS — Z3480 Encounter for supervision of other normal pregnancy, unspecified trimester: Secondary | ICD-10-CM | POA: Diagnosis not present

## 2021-04-29 DIAGNOSIS — Z362 Encounter for other antenatal screening follow-up: Secondary | ICD-10-CM

## 2021-05-12 ENCOUNTER — Encounter: Payer: Self-pay | Admitting: *Deleted

## 2021-05-18 ENCOUNTER — Other Ambulatory Visit (HOSPITAL_COMMUNITY)
Admission: RE | Admit: 2021-05-18 | Discharge: 2021-05-18 | Disposition: A | Discharge status: Home / Self Care | Payer: Medicaid Other | Source: Ambulatory Visit | Attending: Obstetrics and Gynecology | Admitting: Obstetrics and Gynecology

## 2021-05-18 ENCOUNTER — Other Ambulatory Visit: Payer: Self-pay

## 2021-05-18 ENCOUNTER — Ambulatory Visit (INDEPENDENT_AMBULATORY_CARE_PROVIDER_SITE_OTHER): Payer: Medicaid Other | Admitting: Obstetrics and Gynecology

## 2021-05-18 VITALS — BP 119/71 | HR 84 | Wt 123.3 lb

## 2021-05-18 DIAGNOSIS — A749 Chlamydial infection, unspecified: Secondary | ICD-10-CM | POA: Insufficient documentation

## 2021-05-18 DIAGNOSIS — O98812 Other maternal infectious and parasitic diseases complicating pregnancy, second trimester: Secondary | ICD-10-CM | POA: Diagnosis present

## 2021-05-18 DIAGNOSIS — Z3A23 23 weeks gestation of pregnancy: Secondary | ICD-10-CM

## 2021-05-19 LAB — CERVICOVAGINAL ANCILLARY ONLY
Bacterial Vaginitis (gardnerella): NEGATIVE
Candida Glabrata: NEGATIVE
Candida Vaginitis: POSITIVE — AB
Chlamydia: NEGATIVE
Comment: NEGATIVE
Comment: NEGATIVE
Comment: NEGATIVE
Comment: NEGATIVE
Comment: NEGATIVE
Comment: NORMAL
Neisseria Gonorrhea: NEGATIVE
Trichomonas: NEGATIVE

## 2021-05-19 NOTE — Progress Notes (Addendum)
   PRENATAL VISIT NOTE  Subjective:  Nichole Bauer is a 20 y.o. G1P0000 at [redacted]w[redacted]d being seen today for ongoing prenatal care.  She is currently monitored for the following issues for this low-risk pregnancy and has Supervision of other normal pregnancy, antepartum and Chlamydia infection affecting pregnancy in second trimester on their problem list.  Patient reports no complaints.  Contractions: Not present. Vag. Bleeding: None.  Movement: Present. Denies leaking of fluid.   The following portions of the patient's history were reviewed and updated as appropriate: allergies, current medications, past family history, past medical history, past social history, past surgical history and problem list.   Objective:   Vitals:   05/18/21 1104  BP: 119/71  Pulse: 84  Weight: 123 lb 4.8 oz (55.9 kg)    Fetal Status: Fetal Heart Rate (bpm): 141   Movement: Present     General:  Alert, oriented and cooperative. Patient is in no acute distress.  Skin: Skin is warm and dry. No rash noted.   Cardiovascular: Normal heart rate noted  Respiratory: Normal respiratory effort, no problems with respiration noted  Abdomen: Soft, gravid, appropriate for gestational age.  Pain/Pressure: Absent     Pelvic: Cervical exam deferred        Extremities: Normal range of motion.  Edema: None  Mental Status: Normal mood and affect. Normal behavior. Normal judgment and thought content.   Assessment and Plan:  Pregnancy: G1P0000 at [redacted]w[redacted]d 1. Chlamydia infection affecting pregnancy in second trimester Toc today - Cervicovaginal ancillary only( [place])  2. [redacted] weeks gestation of pregnancy 28wk labs nv. Anatomy u/s normal with completion scan scheduled for next month.   Preterm labor symptoms and general obstetric precautions including but not limited to vaginal bleeding, contractions, leaking of fluid and fetal movement were reviewed in detail with the patient. Please refer to After Visit Summary for other  counseling recommendations.   Return in about 4 months (around 09/17/2021) for low risk ob, in person, fasting 2hr GTT, md or app.  Future Appointments  Date Time Provider Department Center  06/03/2021  1:30 PM [facility] NURSE [facility]  06/03/2021  1:45 PM [facility] US5 [facility]  06/20/2021  8:20 AM [facility] LAB [facility]  06/20/2021  8:55 AM [place], Brand Males, NP [facility]    [place] Bing, MD

## 2021-05-25 MED ORDER — MICONAZOLE NITRATE 2 % VA CREA: 1.0000 | TOPICAL_CREAM | Freq: Every day | VAGINAL | 0 refills | Status: DC

## 2021-05-25 NOTE — Addendum Note (Signed)
Addended by: Pomf[place] Bing on: 05/25/2021 09:34 AM   Modules accepted: Orders

## 2021-06-03 ENCOUNTER — Other Ambulatory Visit: Payer: Self-pay

## 2021-06-03 ENCOUNTER — Encounter: Payer: Self-pay | Admitting: *Deleted

## 2021-06-03 ENCOUNTER — Other Ambulatory Visit: Payer: Self-pay | Admitting: Maternal & Fetal Medicine

## 2021-06-03 ENCOUNTER — Ambulatory Visit: Payer: Medicaid Other | Admitting: *Deleted

## 2021-06-03 ENCOUNTER — Ambulatory Visit: Payer: Medicaid Other | Attending: Obstetrics and Gynecology

## 2021-06-03 VITALS — BP 120/67 | HR 105

## 2021-06-03 DIAGNOSIS — Z3480 Encounter for supervision of other normal pregnancy, unspecified trimester: Secondary | ICD-10-CM

## 2021-06-03 DIAGNOSIS — Z362 Encounter for other antenatal screening follow-up: Secondary | ICD-10-CM

## 2021-06-17 ENCOUNTER — Other Ambulatory Visit: Payer: Self-pay

## 2021-06-17 DIAGNOSIS — Z3480 Encounter for supervision of other normal pregnancy, unspecified trimester: Secondary | ICD-10-CM

## 2021-06-20 ENCOUNTER — Other Ambulatory Visit: Payer: Medicaid Other

## 2021-06-20 ENCOUNTER — Encounter: Payer: Medicaid Other | Admitting: Nurse Practitioner

## 2021-06-20 DIAGNOSIS — Z3480 Encounter for supervision of other normal pregnancy, unspecified trimester: Secondary | ICD-10-CM

## 2021-07-14 ENCOUNTER — Other Ambulatory Visit: Payer: Self-pay

## 2021-07-14 ENCOUNTER — Ambulatory Visit (INDEPENDENT_AMBULATORY_CARE_PROVIDER_SITE_OTHER): Payer: Medicaid Other | Admitting: Student

## 2021-07-14 VITALS — BP 112/70 | HR 103 | Wt 138.0 lb

## 2021-07-14 DIAGNOSIS — Z3A31 31 weeks gestation of pregnancy: Secondary | ICD-10-CM

## 2021-07-14 DIAGNOSIS — Z3480 Encounter for supervision of other normal pregnancy, unspecified trimester: Secondary | ICD-10-CM | POA: Diagnosis not present

## 2021-07-14 NOTE — Progress Notes (Signed)
   PRENATAL VISIT NOTE  Subjective:  Nichole Bauer is a 20 y.o. G1P0000 at [redacted]w[redacted]d being seen today for ongoing prenatal care.  She is currently monitored for the following issues for this low-risk pregnancy and has Supervision of other normal pregnancy, antepartum and Chlamydia infection affecting pregnancy in second trimester on their problem list.  Patient reports no complaints.  Contractions: Irritability. Vag. Bleeding: None.  Movement: Present. Denies leaking of fluid.   The following portions of the patient's history were reviewed and updated as appropriate: allergies, current medications, past family history, past medical history, past social history, past surgical history and problem list.   Objective:   Vitals:   07/14/21 1040  BP: 112/70  Pulse: (!) 103  Weight: 138 lb (62.6 kg)    Fetal Status: Fetal Heart Rate (bpm): 150 Fundal Height: 31 cm Movement: Present     General:  Alert, oriented and cooperative. Patient is in no acute distress.  Skin: Skin is warm and dry. No rash noted.   Cardiovascular: Normal heart rate noted  Respiratory: Normal respiratory effort, no problems with respiration noted  Abdomen: Soft, gravid, appropriate for gestational age.  Pain/Pressure: Present     Pelvic: Cervical exam deferred        Extremities: Normal range of motion.  Edema: None  Mental Status: Normal mood and affect. Normal behavior. Normal judgment and thought content.   Assessment and Plan:  Pregnancy: G1P0000 at [redacted]w[redacted]d 1. Supervision of other normal pregnancy, antepartum -patient will reschedule GTT because she was not fasting today -reviewed pap results Preterm  labor symptoms and general obstetric precautions including but not limited to vaginal bleeding, contractions, leaking of fluid and fetal movement were reviewed in detail with the patient. Please refer to After Visit Summary for other counseling recommendations.   Return in about 2 weeks (around 07/28/2021), or  LROB with KK.  Future Appointments  Date Time Provider Department Center  07/15/2021  9:30 AM [facility]    Marylene [place], [place]

## 2021-07-14 NOTE — Addendum Note (Signed)
Addended by: Jarvis Morgan D on: 07/14/2021 03:52 PM   Modules accepted: Orders

## 2021-07-15 ENCOUNTER — Other Ambulatory Visit: Payer: Self-pay | Admitting: Student

## 2021-07-15 ENCOUNTER — Other Ambulatory Visit: Payer: Self-pay

## 2021-07-15 ENCOUNTER — Other Ambulatory Visit: Payer: Medicaid Other

## 2021-07-15 DIAGNOSIS — Z3480 Encounter for supervision of other normal pregnancy, unspecified trimester: Secondary | ICD-10-CM

## 2021-07-15 DIAGNOSIS — D508 Other iron deficiency anemias: Secondary | ICD-10-CM

## 2021-07-15 DIAGNOSIS — D509 Iron deficiency anemia, unspecified: Secondary | ICD-10-CM | POA: Insufficient documentation

## 2021-07-15 LAB — RPR: RPR Ser Ql: NONREACTIVE

## 2021-07-15 LAB — CBC
Hematocrit: 29.1 % — ABNORMAL LOW (ref 34.0–46.6)
Hemoglobin: 9.7 g/dL — ABNORMAL LOW (ref 11.1–15.9)
MCH: 29.5 pg (ref 26.6–33.0)
MCHC: 33.3 g/dL (ref 31.5–35.7)
MCV: 88 fL (ref 79–97)
Platelets: 216 10*3/uL (ref 150–450)
RBC: 3.29 x10E6/uL — ABNORMAL LOW (ref 3.77–5.28)
RDW: 14.1 % (ref 11.7–15.4)
WBC: 10.5 10*3/uL (ref 3.4–10.8)

## 2021-07-15 LAB — HIV ANTIBODY (ROUTINE TESTING W REFLEX): HIV Screen 4th Generation wRfx: NONREACTIVE

## 2021-07-15 MED ORDER — FERROUS SULFATE 325 (65 FE) MG PO TABS: 325.0000 mg | ORAL_TABLET | ORAL | 3 refills | Status: AC

## 2021-07-18 ENCOUNTER — Other Ambulatory Visit: Payer: Medicaid Other

## 2021-07-18 ENCOUNTER — Other Ambulatory Visit: Payer: Self-pay

## 2021-07-18 DIAGNOSIS — Z3480 Encounter for supervision of other normal pregnancy, unspecified trimester: Secondary | ICD-10-CM

## 2021-07-19 LAB — GLUCOSE TOLERANCE, 2 HOURS W/ 1HR
Glucose, 1 hour: 130 mg/dL (ref 70–179)
Glucose, 2 hour: 112 mg/dL (ref 70–152)
Glucose, Fasting: 72 mg/dL (ref 70–91)

## 2021-07-19 LAB — ANTIBODY SCREEN: Antibody Screen: NEGATIVE

## 2021-08-03 ENCOUNTER — Ambulatory Visit (INDEPENDENT_AMBULATORY_CARE_PROVIDER_SITE_OTHER): Payer: Medicaid Other | Admitting: Nurse Practitioner

## 2021-08-03 ENCOUNTER — Other Ambulatory Visit: Payer: Self-pay

## 2021-08-03 VITALS — BP 113/76 | HR 93 | Wt 143.4 lb

## 2021-08-03 DIAGNOSIS — Z3480 Encounter for supervision of other normal pregnancy, unspecified trimester: Secondary | ICD-10-CM

## 2021-08-03 DIAGNOSIS — D508 Other iron deficiency anemias: Secondary | ICD-10-CM

## 2021-08-03 DIAGNOSIS — Z3A34 34 weeks gestation of pregnancy: Secondary | ICD-10-CM

## 2021-08-03 DIAGNOSIS — K5901 Slow transit constipation: Secondary | ICD-10-CM

## 2021-08-03 MED ORDER — DOCUSATE CALCIUM 240 MG PO CAPS: 240.0000 mg | ORAL_CAPSULE | Freq: Every day | ORAL | 2 refills | Status: DC

## 2021-08-03 NOTE — Patient Instructions (Addendum)
Check out [email or web address] for childbirth and breastfeeding classes.      BRAINSTORMING  Develop a Plan Goals: Provide a way to start conversation about your new life with a baby Assist parents in recognizing and using resources within their reach Help pave the way before birth for an easier period of transition afterwards.  Make a list of the following information to keep in a central location: Full name of Mom and Partner: _____________________________________________ 30 full name and Date of Birth: ___________________________________________ Home Address: ___________________________________________________________ ________________________________________________________________________ Home Phone: ____________________________________________________________ Parents' cell numbers: _____________________________________________________ ________________________________________________________________________ Name and contact info for OB: ______________________________________________ Name and contact info for Pediatrician:________________________________________ Contact info for Lactation Consultants: ________________________________________  REST and SLEEP *You each need at least 4-5 hours of uninterrupted sleep every day. Write specific names and contact information.* How are you going to rest in the postpartum period? While partner's home? When partner returns to work? When you both return to work? Where will your baby sleep? Who is available to help during the day? Evening? Night? Who could move in for a period to help support you? What are some ideas to help you get enough sleep? __________________________________________________________________________________________________________________________________________________________________________________________________________________________________________ NUTRITIOUS FOOD AND DRINK *Plan for meals before your baby is  born so you can have healthy food to eat during the immediate postpartum period.* Who will look after breakfast? Lunch? Dinner? List names and contact information. Brainstorm quick, healthy ideas for each meal. What can you do before baby is born to prepare meals for the postpartum period? How can others help you with meals? Which grocery stores provide online shopping and delivery? Which restaurants offer take-out or delivery options? ______________________________________________________________________________________________________________________________________________________________________________________________________________________________________________________________________________________________________________________________________________________________________________________________________  CARE FOR MOM *It's important that mom is cared for and pampered in the postpartum period. Remember, the most important ways new mothers need care are: sleep, nutrition, gentle exercise, and time off.* Who can come take care of mom during this period? Make a list of people with their contact information. List some activities that make you feel cared for, rested, and energized? Who can make sure you have opportunities to do these things? Does mom have a space of her very own within your home that's just for her? Make a "[facility]" where she can be comfortable, rest, and renew herself daily. ______________________________________________________________________________________________________________________________________________________________________________________________________________________________________________________________________________________________________________________________________________________________________________________________________    CARE FOR AND FEEDING BABY *Knowledgeable and encouraging people will offer the best support with regard to  feeding your baby.* Educate yourself and choose the best feeding option for your baby. Make a list of people who will guide, support, and be a resource for you as your care for and feed your baby. (Friends that have breastfed or are currently breastfeeding, lactation consultants, breastfeeding support groups, etc.) Consider a postpartum doula. (These websites can give you information: dona.org & BuyingShow.es) Seek out local breastfeeding resources like the breastfeeding support group at [employer] or [employer]. ______________________________________________________________________________________________________________________________________________________________________________________________________________________________________________________________________________________________________________________________________________________________________________________________________  Verner Chol AND ERRANDS Who can help with a thorough cleaning before baby is born? Make a list of people who will help with housekeeping and chores, like laundry, light cleaning, dishes, bathrooms, etc. Who can run some errands for you? What can you do to make sure you are stocked with basic supplies before baby is born? Who is going to do the shopping? ______________________________________________________________________________________________________________________________________________________________________________________________________________________________________________________________________________________________________________________________________________________________________________________________________     Family Adjustment *Nurture yourselves.it helps parents be more loving and allows for better bonding with their child.* What sorts of things do you and partner enjoy doing together? Which activities help you to connect and strengthen your relationship? Make a  list of those  things. Make a list of people whom you trust to care for your baby so you can have some time together as a couple. What types of things help partner feel connected to Mom? Make a list. What needs will partner have in order to bond with baby? Other children? Who will care for them when you go into labor and while you are in the hospital? Think about what the needs of your older children might be. Who can help you meet those needs? In what ways are you helping them prepare for bringing baby home? List some specific strategies you have for family adjustment. _______________________________________________________________________________________________________________________________________________________________________________________________________________________________________________________________________________________________________________________________________________  SUPPORT *Someone who can empathize with experiences normalizes your problems and makes them more bearable.* Make a list of other friends, neighbors, and/or co-workers you know with infants (and small children, if applicable) with whom you can connect. Make a list of local or online support groups, mom groups, etc. in which you can be involved. ______________________________________________________________________________________________________________________________________________________________________________________________________________________________________________________________________________________________________________________________________________________________________________________________________  Childcare Plans Investigate and plan for childcare if mom is returning to work. Talk about mom's concerns about her transition back to work. Talk about partner's concerns regarding this transition.  Mental Health *Your mental health is one of the highest priorities for a pregnant or  postpartum mom.* 1 in 5 women experience anxiety and/or depression from the time of conception through the first year after birth. Postpartum Mood Disorders are the #1 complication of pregnancy and childbirth and the suffering experienced by these mothers is not necessary! These illnesses are temporary and respond well to treatment, which often includes self-care, social support, talk therapy, and medication when needed. Women experiencing anxiety and depression often say things like: "I'm supposed to be happy.why do I feel so sad?", "Why can't I snap out of it?", "I'm having thoughts that scare me." There is no need to be embarrassed if you are feeling these symptoms: Overwhelmed, anxious, angry, sad, guilty, irritable, hopeless, exhausted but can't sleep You are NOT alone. You are NOT to blame. With help, you WILL be well. Where can I find help? Medical professionals such as your OB, midwife, gynecologist, family practitioner, primary care provider, pediatrician, or mental health providers; [facility] support groups: Feelings After Birth, Breastfeeding Support Group, Baby and Me Group, and Fit 4 Two exercise classes. You have permission to ask for help. It will confirm your feelings, validate your experiences, share/learn coping strategies, and gain support and encouragement as you heal. You are important! BRAINSTORM Make a list of local resources, including resources for mom and for partner. Identify support groups. Identify people to call late at night - include names and contact info. Talk with partner about perinatal mood and anxiety disorders. Talk with your OB, midwife, and doula about baby blues and about perinatal mood and anxiety disorders. Talk with your pediatrician about perinatal mood and anxiety disorders.   Support & Sanity Savers   What do you really need?  Basics In preparing for a new baby, many expectant parents spend hours shopping for baby clothes, decorating the  nursery, and deciding which car seat to buy. Yet most don't think much about what the reality of parenting a newborn will be like, and what they need to make it through that. So, here is the advice of experienced parents. We know you'll read this, and think "they're exaggerating, I don't really need that." Just trust [place] on these, OK? Plan for all of this, and if it turns out you don't need it, come back and  teach [place] how you did it!  Must-Haves (Once baby's survival needs are met, make sure you attend to your own survival needs!) Sleep An average newborn sleeps 16-18 hours per day, over 6-7 sleep periods, rarely more than three hours at a time. It is normal and healthy for a newborn to wake throughout the night... but really hard on parents!! Naps. Prioritize sleep above any responsibilities like: cleaning house, visiting friends, running errands, etc.  Sleep whenever baby sleeps. If you can't nap, at least have restful times when baby eats. The more rest you get, the more patient you will be, the more emotionally stable, and better at solving problems.  Food You may not have realized it would be difficult to eat when you have a newborn. Yet, when we talk to countless new parents, they say things like "it may be 2:00 pm when I realize I haven't had breakfast yet." Or "every time we sit down to dinner, baby needs to eat, and my food gets cold, so I don't bother to eat it." Finger food. Before your baby is born, stock up with one months' worth of food that: 1) you can eat with one hand while holding a baby, 2) doesn't need to be prepped, 3) is good hot or cold, 4) doesn't spoil when left out for a few hours, and 5) you like to eat. Think about: nuts, dried fruit, Clif bars, pretzels, jerky, gogurt, baby carrots, apples, bananas, crackers, cheez-n-crackers, string cheese, hot pockets or frozen burritos to microwave, garden burgers and breakfast pastries to put in the toaster, yogurt drinks, etc. Restaurant  Menus. Make lists of your favorite restaurants & menu items. When family/friends want to help, you can give specific information without much thought. They can either bring you the food or send gift cards for just the right meals. Freezer Meals.  Take some time to make a few meals to put in the freezer ahead of time.  Easy to freeze meals can be anything such as soup, lasagna, chicken pie, or spaghetti sauce. Set up a Meal Schedule.  Ask friends and family to sign up to bring you meals during the first few weeks of being home. (It can be passed around at baby showers!) You have no idea how helpful this will be until you are in the throes of parenting.  https://hamilton-woodard.com/ is a great website to check out. Emotional Support Know who to call when you're stressed out. Parenting a newborn is very challenging work. There are times when it totally overwhelms your normal coping abilities. EVERY NEW PARENT NEEDS TO HAVE A PLAN FOR WHO TO CALL WHEN THEY JUST CAN'T COPE ANY MORE. (And it has to be someone other than the baby's other parent!) Before your baby is born, come up with at least one person you can call for support - write their phone number down and post it on the refrigerator. Anxiety & Sadness. Baby blues are normal after pregnancy; however, there are more severe types of anxiety & sadness which can occur and should not be ignored.  They are always treatable, but you have to take the first step by reaching out for help. [facility] offers a "Mom Talk" group which meets every Tuesday from 10 am - 11 am.  This group is for new moms who need support and connection after their babies are born.  Call [phone].  Really, Really Helpful (Plan for them! Make sure these happen often!!) Physical Support with Taking Care of Yourselves Asking friends  and family. Before your baby is born, set up a schedule of people who can come and visit and help out (or ask a friend to schedule for you). Any time someone  says "let me know what I can do to help," sign them up for a day. When they get there, their job is not to take care of the baby (that's your job and your joy). Their job is to take care of you!  Postpartum doulas. If you don't have anyone you can call on for support, look into postpartum doulas:  professionals at helping parents with caring for baby, caring for themselves, getting breastfeeding started, and helping with household tasks. www.padanc.org is a helpful website for learning about doulas in our area. Peer Support / Parent Groups Why: One of the greatest ideas for new parents is to be around other new parents. Parent groups give you a chance to share and listen to others who are going through the same season of life, get a sense of what is normal infant development by watching several babies learn and grow, share your stories of triumph and struggles with empathetic ears, and forgive your own mistakes when you realize all parents are learning by trial and error. Where to find: There are many places you can meet other new parents throughout our community.  [facility] offers the following classes for new moms and their little ones:  Baby and Me (Birth to [place]) and Breastfeeding Support Group. Go to [facility].com or call [phone] for more information. Time for your Relationship It's easy to get so caught up in meeting baby's immediate needs that it's hard to find time to connect with your partner, and meet the needs of your relationship. It's also easy to forget what "quality time with your partner" actually looks like. If you take your baby on a date, you'd be amazed how much of your couple time is spent feeding the baby, diapering the baby, admiring the baby, and talking about the baby. Dating: Try to take time for just the two of you. Babysitter tip: Sometimes when moms are breastfeeding a newborn, they find it hard to figure out how to schedule outings around baby's  unpredictable feeding schedules. Have the babysitter come for a three hour period. When she comes over, if baby has just eaten, you can leave right away, and come back in two hours. If baby hasn't fed recently, you start the date at home. Once baby gets hungry and gets a good feeding in, you can head out for the rest of your date time. Date Nights at Home: If you can't get out, at least set aside one evening a week to prioritize your relationship: whenever baby dozes off or doesn't have any immediate needs, spend a little time focusing on each other. Potential conflicts: The main relationship conflicts that come up for new parents are: issues related to sexuality, financial stresses, a feeling of an unfair division of household tasks, and conflicts in parenting styles. The more you can work on these issues before baby arrives, the better!  Fun and Frills (Don't forget these. and don't feel guilty for indulging in them!) Everyone has something in life that is a fun little treat that they do just for themselves. It may be: reading the morning paper, or going for a daily jog, or having coffee with a friend once a week, or going to a movie on Friday nights, or fine chocolates, or bubble baths, or curling up with a good book.  Unless you do fun things for yourself every now and then, it's hard to have the energy for fun with your baby. Whatever your "special" treats are, make sure you find a way to continue to indulge in them after your baby is born. These special moments can recharge you, and allow you to return to baby with a new joy   PERINATAL MOOD DISORDERS: [place]   _________________________________________Emergency and Crisis Resources If you are an imminent risk to self or others, are experiencing intense personal distress, and/or have noticed significant changes in activities of daily living, call:  [place]: [phone]  [address], [place], [place], [address] Mobile Crisis: [place]: 988 Or visit the following crisis centers: Local Emergency Departments [facility]: [address], [place]. Hours: 8:30AM-5PM. Insurance Accepted: Medicaid, Medicare, and Uninsured.  [facility]:  [address], [place]  Mon-Friday 8am-3pm, [phone]                                                                                  ___________ Non-Crisis Resources To identify specific providers that are covered by your insurance, contact your insurance company or local agencies:  [place]: [phone] [facility] [employer]: [place]: [phone] Postpartum Support International- Warm-line: (601)525-8338                                                      __Outpatient Therapy and Medication Management   Providers:  [facility]: [phone] Hours: 9AM-5PM  Insurance Accepted: Alben Spittle, Shane Crutch, [place], [place] [facility]): [phone] Hours: 8AM-5:30PM  nsurance Accepted: All insurances EXCEPT AARP, [place], [place], and [place]: [phone] Hours: 8AM-8PM Insurance Accepted: Cristal Ford, Freddrick March, [place], Medicare, [phone] [facility]: [phone] Hours: 8:30AM-7PM Insurance Accepted: Cristal Ford, Medicaid, Medicare, Tricare, [employer] Counseling:  [place] Accepted:  Holland Falling, Lorella Nimrod, [employer], [place]: [phone] Hours: 9AM-5:30PM Insurance Accepted: Alben Spittle, Charlotte Crumb, and Medicaid, Medicare, [facility]:  [phone] Hours: 9am-5pm  Insurance Accepted: BCBS; they do not accept Medicaid/Medicare [facility] [place]: [phone] Hours: 9am-9pm Insurance Accepted: All major insurance including Medicaid and Medicare [facility]: [phone] Hours: [place] Accepted: All insurances EXCEPT Medicaid and Medicare. [facility]: [phone]   ____________                                                                     Parenting [place]: [phone] [place]:  [phone]- [phone] Family Support Network: (support for children in the NICU and/or with special needs), [phone]   ___________                                                                 Mental Health Support Groups Mental Health Association: [phone]    _____________                                                                                  Online Resources Postpartum Support International: http://jones-berg.com/  800-944-4PPD 2Moms Supporting Moms:  www.momssupportingmoms.net

## 2021-08-03 NOTE — Progress Notes (Signed)
    Subjective:  Nichole Bauer is a 20 y.o. G1P0000 at [redacted]w[redacted]d being seen today for ongoing prenatal care.  She is currently monitored for the following issues for this low-risk pregnancy and has Supervision of other normal pregnancy, antepartum; Chlamydia infection affecting pregnancy in second trimester; and Iron deficiency anemia on their problem list.  Patient reports no complaints.  Contractions: Not present. Vag. Bleeding: None.  Movement: Present. Denies leaking of fluid.   The following portions of the patient's history were reviewed and updated as appropriate: allergies, current medications, past family history, past medical history, past social history, past surgical history and problem list. Problem list updated.  Objective:   Vitals:   08/03/21 1555  BP: 113/76  Pulse: 93  Weight: 143 lb 6.4 oz (65 kg)    Fetal Status: Fetal Heart Rate (bpm): 130 Fundal Height: 34 cm Movement: Present     General:  Alert, oriented and cooperative. Patient is in no acute distress.  Skin: Skin is warm and dry. No rash noted.   Cardiovascular: Normal heart rate noted  Respiratory: Normal respiratory effort, no problems with respiration noted  Abdomen: Soft, gravid, appropriate for gestational age. Pain/Pressure: Present     Pelvic:  Cervical exam deferred        Extremities: Normal range of motion.  Edema: None  Mental Status: Normal mood and affect. Normal behavior. Normal judgment and thought content.   Urinalysis:      Assessment and Plan:  Pregnancy: G1P0000 at 104w2d  1. Supervision of other normal pregnancy, antepartum Doing well Baby is moving well Advised child birth and breastfeeding classes - given website to sign up. Given postpartum resources - attempting to get her own apartment and her mother is coming next week to help her get ready for the baby - has moved here from [place] for a "new start".  Plans to have her mother help her to select a pediatrician. Taking iron  supplement Constipation - prescribed stool softener.  Encouraged increased PO fluids.  2. [redacted] weeks gestation of pregnancy   Preterm labor symptoms and general obstetric precautions including but not limited to vaginal bleeding, contractions, leaking of fluid and fetal movement were reviewed in detail with the patient. Please refer to After Visit Summary for other counseling recommendations.  Return in about 2 weeks (around 08/17/2021) for in person for vaginal swabs.  Nolene Bernheim, RN, MSN, NP-BC Nurse Practitioner, [facility] for [employer], [facility] 08/03/2021 4:18 PM

## 2021-08-17 ENCOUNTER — Encounter: Payer: Medicaid Other | Admitting: Obstetrics and Gynecology

## 2021-08-18 ENCOUNTER — Encounter: Payer: Medicaid Other | Admitting: Obstetrics & Gynecology

## 2021-08-23 ENCOUNTER — Encounter: Payer: Self-pay | Admitting: Family Medicine

## 2021-08-23 ENCOUNTER — Other Ambulatory Visit (HOSPITAL_COMMUNITY)
Admission: RE | Admit: 2021-08-23 | Discharge: 2021-08-23 | Disposition: A | Discharge status: Home / Self Care | Payer: Medicaid Other | Source: Ambulatory Visit | Attending: Obstetrics and Gynecology | Admitting: Obstetrics and Gynecology

## 2021-08-23 ENCOUNTER — Other Ambulatory Visit: Payer: Self-pay

## 2021-08-23 ENCOUNTER — Ambulatory Visit (INDEPENDENT_AMBULATORY_CARE_PROVIDER_SITE_OTHER): Payer: Medicaid Other | Admitting: Family Medicine

## 2021-08-23 VITALS — BP 124/74 | HR 97 | Wt 146.7 lb

## 2021-08-23 DIAGNOSIS — Z3480 Encounter for supervision of other normal pregnancy, unspecified trimester: Secondary | ICD-10-CM

## 2021-08-23 DIAGNOSIS — D649 Anemia, unspecified: Secondary | ICD-10-CM | POA: Diagnosis not present

## 2021-08-23 DIAGNOSIS — Z3A37 37 weeks gestation of pregnancy: Secondary | ICD-10-CM | POA: Insufficient documentation

## 2021-08-23 DIAGNOSIS — O99013 Anemia complicating pregnancy, third trimester: Secondary | ICD-10-CM | POA: Diagnosis not present

## 2021-08-23 DIAGNOSIS — D508 Other iron deficiency anemias: Secondary | ICD-10-CM

## 2021-08-23 LAB — OB RESULTS CONSOLE GC/CHLAMYDIA: Gonorrhea: NEGATIVE

## 2021-08-23 NOTE — Progress Notes (Signed)
Patient reports daily round ligament pain along with braxton hicks

## 2021-08-23 NOTE — Progress Notes (Signed)
    Subjective:  Nichole Bauer is a 20 y.o. G1P0000 at [redacted]w[redacted]d being seen today for ongoing prenatal care.  She is currently monitored for the following issues for this low-risk pregnancy and has Supervision of other normal pregnancy, antepartum; Chlamydia infection affecting pregnancy in second trimester; and Iron deficiency anemia on their problem list.  Patient reports occasional contractions.  Contractions: Irritability. Vag. Bleeding: None.  Movement: Present. Denies leaking of fluid.   The following portions of the patient's history were reviewed and updated as appropriate: allergies, current medications, past family history, past medical history, past social history, past surgical history and problem list.   Objective:   Vitals:   08/23/21 1050  BP: 124/74  Pulse: 97  Weight: 146 lb 11.2 oz (66.5 kg)    Fetal Status: Fetal Heart Rate (bpm): 152 Fundal Height: 37 cm Movement: Present  Presentation: Vertex  General:  Alert, oriented and cooperative. Patient is in no acute distress.  Skin: Skin is warm and dry. No rash noted.   Cardiovascular: Normal heart rate noted  Respiratory: Normal respiratory effort, no problems with respiration noted  Abdomen: Soft, gravid, appropriate for gestational age. Pain/Pressure: Present     Pelvic:  Cervical exam deferred        Extremities: Normal range of motion.  Edema: None  Mental Status: Normal mood and affect. Normal behavior. Normal judgment and thought content.   Vertex by handheld [place] and leopold's.  Pt informed that the ultrasound is considered a limited OB ultrasound and is not intended to be a complete ultrasound exam.  Patient also informed that the ultrasound is not being completed with the intent of assessing for fetal or placental anomalies or any pelvic abnormalities.  Explained that the purpose of today's ultrasound is to assess for  presentation.  Patient acknowledges the purpose of the exam and the limitations of the study.      Assessment and Plan:  Pregnancy: G1P0000 at [redacted]w[redacted]d  1. Supervision of other normal pregnancy, antepartum Doing well with normal fetal movement.   2. [redacted] weeks gestation of pregnancy - Culture, beta strep (group b only) - Cervicovaginal ancillary only( [place])  3. Other iron deficiency anemia Cont ferrous sulfate. Asymptomatic.   Term labor symptoms and general obstetric precautions including but not limited to vaginal bleeding, contractions, leaking of fluid and fetal movement were reviewed in detail with the patient. Please refer to After Visit Summary for other counseling recommendations.   Return in about 1 week (around 08/30/2021) for LROB.   [name], DO

## 2021-08-24 LAB — CERVICOVAGINAL ANCILLARY ONLY
Chlamydia: POSITIVE — AB
Comment: NEGATIVE
Comment: NORMAL
Neisseria Gonorrhea: NEGATIVE

## 2021-08-25 ENCOUNTER — Other Ambulatory Visit: Payer: Self-pay | Admitting: Family Medicine

## 2021-08-25 DIAGNOSIS — O98813 Other maternal infectious and parasitic diseases complicating pregnancy, third trimester: Secondary | ICD-10-CM

## 2021-08-25 DIAGNOSIS — A749 Chlamydial infection, unspecified: Secondary | ICD-10-CM

## 2021-08-25 MED ORDER — AZITHROMYCIN 1 G PO PACK: 1.0000 g | PACK | Freq: Once | ORAL | 0 refills | Status: AC

## 2021-08-25 NOTE — Progress Notes (Signed)
Positive Chlamydia result. Pt notified by Annia Friendly, DO and treatment sent to pharmacy. Communicable disease report sent to [facility].

## 2021-08-27 LAB — CULTURE, BETA STREP (GROUP B ONLY): Strep Gp B Culture: NEGATIVE

## 2021-09-06 ENCOUNTER — Ambulatory Visit (INDEPENDENT_AMBULATORY_CARE_PROVIDER_SITE_OTHER): Payer: Medicaid Other | Admitting: Obstetrics and Gynecology

## 2021-09-06 ENCOUNTER — Other Ambulatory Visit: Payer: Self-pay

## 2021-09-06 VITALS — BP 119/77 | HR 113 | Wt 153.0 lb

## 2021-09-06 DIAGNOSIS — R87629 Unspecified abnormal cytological findings in specimens from vagina: Secondary | ICD-10-CM | POA: Insufficient documentation

## 2021-09-06 DIAGNOSIS — Z3480 Encounter for supervision of other normal pregnancy, unspecified trimester: Secondary | ICD-10-CM

## 2021-09-06 NOTE — Progress Notes (Signed)
   PRENATAL VISIT NOTE  Subjective:  Nichole Bauer is a 20 y.o. G1P0000 at [redacted]w[redacted]d being seen today for ongoing prenatal care.  She is currently monitored for the following issues for this low-risk pregnancy and has Supervision of other normal pregnancy, antepartum; Chlamydia infection affecting pregnancy in second trimester; Iron deficiency anemia; and Abnormal vaginal Pap smear on their problem list.  Patient reports no complaints.  Contractions: Irritability. Vag. Bleeding: None.  Movement: Present. Denies leaking of fluid.   The following portions of the patient's history were reviewed and updated as appropriate: allergies, current medications, past family history, past medical history, past social history, past surgical history and problem list.   Objective:   Vitals:   09/06/21 1358  BP: 119/77  Pulse: (!) 113  Weight: 153 lb (69.4 kg)    Fetal Status: Fetal Heart Rate (bpm): 154 Fundal Height: 40 cm Movement: Present     General:  Alert, oriented and cooperative. Patient is in no acute distress.  Skin: Skin is warm and dry. No rash noted.   Cardiovascular: Normal heart rate noted  Respiratory: Normal respiratory effort, no problems with respiration noted  Abdomen: Soft, gravid, appropriate for gestational age.  Pain/Pressure: Present     Pelvic: Cervical exam performed in the presence of a chaperone        Extremities: Normal range of motion.  Edema: None  Mental Status: Normal mood and affect. Normal behavior. Normal judgment and thought content.   Assessment and Plan:  Pregnancy: G1P0000 at [redacted]w[redacted]d  1. Abnormal vaginal Pap smear  ASC-US- per [facility] routine screening.   2. Supervision of other normal pregnancy, antepartum  GBS negative  Patient confirmed she did complete treatment for chlamydia.  The only available induction for post dates is 12/7 @ 1200 scheduled, patient to come at 1145 pm on 12/6   Term labor symptoms and general obstetric precautions including  but not limited to vaginal bleeding, contractions, leaking of fluid and fetal movement were reviewed in detail with the patient. Please refer to After Visit Summary for other counseling recommendations.   Return in about 1 week (around 09/13/2021).  Future Appointments  Date Time Provider Department Center  09/12/2021 12:00 AM [facility] SCHED ROOM [facility] None  09/15/2021 10:55 AM Bernerd Limbo, CNM [facility]    Venia Carbon, NP

## 2021-09-06 NOTE — Patient Instructions (Signed)
The [employer]  This circuit takes at least 90 minutes to complete so clear your schedule and make mental preparations so you can relax in your environment. The second step requires a lot of pillows so gather them up before beginning Before starting, you should empty your bladder! Have a nice drink nearby, and make sure it has a straw! If you are having contractions, this circuit should be done through contractions, try not to change positions between steps Before you begin...  "I named this 'circuit' after my friend Nichole Bauer, who shared and discussed it with me when I was working with a client whose labor seemed to be stalled out and no longer progressing... This circuit is useful to help get the baby lined up, ideally, in the "Left Occiput Anterior" (LOA) Position, both before labor begins and when some corrections need to be done during labor. Prenatally, this position set can help to rotate a baby. As a natural method of induction, this can help get things going if baby just needed a gentle nudge of position to set things off. To the best of my knowledge, this group of positions will not "hurt" a baby that is already lined up correctly." - Nichole Bauer   Step One: Open-knee Chest Stay in this position for 30 minutes, start in cat/cow, then drop your chest as low as you can to the bed or the floor and your bottom as high as you can. Knees should be fairly wide apart, and the angle between the torso/thighs should be wider than 90 degrees. Wiggle around, prop with lots of pillows and use this time to get totally relaxed. This position allows the baby to scoot out of the pelvis a bit and gives them room to rotate, shift their head position, etc. If the pregnant person finds it helpful, careful positioning with a rebozo under the belly, with gentle tension from a support person behind can help maintain this position for the full 30 minutes.  Step Two:Exaggerated Left Side  Lying Roll to your left side, bringing your top leg as high as possible and keeping your bottom leg straight. Roll forward as much as possible, again using a lot of pillows. Sink into the bed and relax some more. If you fall asleep, that's totally okay and you can stay there! If not, stay here for at least another half an hour. Try and get your top right leg up towards your head and get as rolled over onto your belly as much as possible. If you repeat the circuit during labor, try alternating left and right sides. We know the photo the left is actually right side... just flip the image in your head.  Step Three: Moving and Lunges Lunge, walk stairs facing sideways, 2 at a time, (have a spotter downstairs of you!), take a walk outside with one foot on the curb and the other on the street, sit on a birth ball and hula- anything that's upright and putting your pelvis in open, asymmetrical positions. Spend at least 30 minutes doing this one as well to give your baby a chance to move down. If you are lunging or stair or curb walking, you should lunge/walk/go up stairs in the direction that feels better to you. The key with the lunge is that the toes of the higher leg and mom's belly button should be at right angles. Do not lunge over your knee, that closes the pelvis.     Nichole Hamilton Bauer: Circuit Creator - [email or web address] Nichole   Bauer, CD, BDT (DONA), LCCE, FACCE: Supporting Content - [email or web address] Nichole Bauer: Photography - [email or web address] Nichole Bauer CD/CDT (BAI): Print and Webmaster - [email or web address] [employer] [email or web address]  

## 2021-09-09 ENCOUNTER — Other Ambulatory Visit: Payer: Self-pay | Admitting: Advanced Practice Midwife

## 2021-09-12 ENCOUNTER — Encounter (HOSPITAL_COMMUNITY): Payer: Self-pay | Admitting: Family Medicine

## 2021-09-12 ENCOUNTER — Inpatient Hospital Stay (HOSPITAL_COMMUNITY)
Admission: AD | Admit: 2021-09-12 | Discharge: 2021-09-12 | Disposition: A | Discharge status: Home / Self Care | Payer: Medicaid Other | Attending: Family Medicine | Admitting: Family Medicine

## 2021-09-12 ENCOUNTER — Other Ambulatory Visit: Payer: Self-pay

## 2021-09-12 ENCOUNTER — Inpatient Hospital Stay (HOSPITAL_COMMUNITY): Admission: AD | Admit: 2021-09-12 | Payer: Medicaid Other | Source: Home / Self Care | Admitting: Family Medicine

## 2021-09-12 ENCOUNTER — Inpatient Hospital Stay (HOSPITAL_COMMUNITY): Payer: Medicaid Other

## 2021-09-12 DIAGNOSIS — Z0371 Encounter for suspected problem with amniotic cavity and membrane ruled out: Secondary | ICD-10-CM | POA: Diagnosis not present

## 2021-09-12 DIAGNOSIS — O471 False labor at or after 37 completed weeks of gestation: Secondary | ICD-10-CM | POA: Insufficient documentation

## 2021-09-12 DIAGNOSIS — O480 Post-term pregnancy: Secondary | ICD-10-CM | POA: Insufficient documentation

## 2021-09-12 DIAGNOSIS — Z3A40 40 weeks gestation of pregnancy: Secondary | ICD-10-CM | POA: Insufficient documentation

## 2021-09-12 LAB — AMNISURE RUPTURE OF MEMBRANE (ROM) NOT AT ARMC: Amnisure ROM: NEGATIVE

## 2021-09-12 LAB — POCT FERN TEST: POCT Fern Test: NEGATIVE

## 2021-09-12 NOTE — MAU Provider Note (Signed)
S: Ms. Rasa Degrazia is a 20 y.o. G1P0000 at [redacted]w[redacted]d  who presents to MAU today complaining of leaking of fluid since this morning. She denies vaginal bleeding. She endorses contractions. She reports normal fetal movement.    O: BP 120/85   Pulse 87   Temp 98.6 F (37 C) (Oral)   Resp 16   Ht 4\' 10"  (1.473 m)   Wt 70.5 kg   LMP 12/06/2020 (Approximate)   SpO2 100%   BMI 32.50 kg/m  GENERAL: Well-developed, well-nourished female in no acute distress.  HEAD: Normocephalic, atraumatic.  CHEST: Normal effort of breathing, regular heart rate ABDOMEN: Soft, nontender, gravid PELVIC: Normal external female genitalia. Vagina is pink and rugated. Cervix with normal contour, no lesions. Normal discharge.  Negative pooling.   Cervical exam:  Dilation: Fingertip Exam by:: Mili Piltz np   Fetal Monitoring: Baseline: 135 bpm Variability: Moderate  Accelerations: 15x15 Decelerations: None Contractions: Occasional   Results for orders placed or performed during the hospital encounter of 09/12/21 (from the past 24 hour(s))  Amnisure rupture of membrane (rom)not at [facility]     Status: None   Collection Time: 09/12/21 11:35 AM  Result Value Ref Range   Amnisure ROM NEGATIVE   POCT fern test     Status: None   Collection Time: 09/12/21 11:41 AM  Result Value Ref Range   POCT Fern Test Negative = intact amniotic membranes      A: SIUP at [redacted]w[redacted]d  Membranes intact   P:  DC Home Keep your scheduled induction Labor precautions.   [redacted]w[redacted]d I, NP 09/12/2021 3:08 PM

## 2021-09-12 NOTE — MAU Note (Signed)
Nichole Bauer is a 20 y.o. at [redacted]w[redacted]d here in MAU reporting: states her mucus plug came out the other day. Today noticed a small amount of watery leaking. States it was brown/green. Not having to wear a pad. No bleeding. No pain.  Onset of complaint: today  Pain score: 0/10  Vitals:   09/12/21 1033  BP: 130/80  Pulse: (!) 101  Resp: 16  Temp: 98.6 F (37 C)  SpO2: 100%     FHT: +FM, monitors applied  Lab orders placed from triage: none

## 2021-09-14 ENCOUNTER — Inpatient Hospital Stay (HOSPITAL_COMMUNITY): Payer: Medicaid Other | Admitting: Anesthesiology

## 2021-09-14 ENCOUNTER — Encounter (HOSPITAL_COMMUNITY): Payer: Self-pay | Admitting: Family Medicine

## 2021-09-14 ENCOUNTER — Inpatient Hospital Stay (HOSPITAL_COMMUNITY)
Admission: AD | Admit: 2021-09-14 | Discharge: 2021-09-17 | DRG: 805 | Disposition: A | Discharge status: Home / Self Care | Payer: Medicaid Other | Attending: Obstetrics & Gynecology | Admitting: Obstetrics & Gynecology

## 2021-09-14 ENCOUNTER — Inpatient Hospital Stay (HOSPITAL_COMMUNITY): Payer: Medicaid Other

## 2021-09-14 ENCOUNTER — Other Ambulatory Visit: Payer: Self-pay

## 2021-09-14 DIAGNOSIS — O403XX0 Polyhydramnios, third trimester, not applicable or unspecified: Secondary | ICD-10-CM | POA: Diagnosis not present

## 2021-09-14 DIAGNOSIS — O1415 Severe pre-eclampsia, complicating the puerperium: Secondary | ICD-10-CM | POA: Diagnosis not present

## 2021-09-14 DIAGNOSIS — R87629 Unspecified abnormal cytological findings in specimens from vagina: Secondary | ICD-10-CM

## 2021-09-14 DIAGNOSIS — Z3480 Encounter for supervision of other normal pregnancy, unspecified trimester: Secondary | ICD-10-CM

## 2021-09-14 DIAGNOSIS — O480 Post-term pregnancy: Secondary | ICD-10-CM | POA: Diagnosis not present

## 2021-09-14 DIAGNOSIS — O9902 Anemia complicating childbirth: Secondary | ICD-10-CM | POA: Diagnosis present

## 2021-09-14 DIAGNOSIS — Z20822 Contact with and (suspected) exposure to covid-19: Secondary | ICD-10-CM | POA: Diagnosis not present

## 2021-09-14 DIAGNOSIS — O411230 Chorioamnionitis, third trimester, not applicable or unspecified: Secondary | ICD-10-CM | POA: Diagnosis not present

## 2021-09-14 DIAGNOSIS — D509 Iron deficiency anemia, unspecified: Secondary | ICD-10-CM | POA: Diagnosis not present

## 2021-09-14 DIAGNOSIS — Z3A40 40 weeks gestation of pregnancy: Secondary | ICD-10-CM | POA: Diagnosis not present

## 2021-09-14 DIAGNOSIS — A749 Chlamydial infection, unspecified: Secondary | ICD-10-CM | POA: Diagnosis present

## 2021-09-14 DIAGNOSIS — O411290 Chorioamnionitis, unspecified trimester, not applicable or unspecified: Secondary | ICD-10-CM | POA: Diagnosis present

## 2021-09-14 LAB — RESP PANEL BY RT-PCR (FLU A&B, COVID) ARPGX2
Influenza A by PCR: NEGATIVE
Influenza B by PCR: NEGATIVE
SARS Coronavirus 2 by RT PCR: NEGATIVE

## 2021-09-14 LAB — CBC
HCT: 39.7 % (ref 36.0–46.0)
Hemoglobin: 12.4 g/dL (ref 12.0–15.0)
MCH: 29.4 pg (ref 26.0–34.0)
MCHC: 31.2 g/dL (ref 30.0–36.0)
MCV: 94.1 fL (ref 80.0–100.0)
Platelets: 221 10*3/uL (ref 150–400)
RBC: 4.22 MIL/uL (ref 3.87–5.11)
RDW: 20.3 % — ABNORMAL HIGH (ref 11.5–15.5)
WBC: 10.8 10*3/uL — ABNORMAL HIGH (ref 4.0–10.5)
nRBC: 0 % (ref 0.0–0.2)

## 2021-09-14 LAB — TYPE AND SCREEN
ABO/RH(D): O POS
Antibody Screen: NEGATIVE

## 2021-09-14 LAB — RPR: RPR Ser Ql: NONREACTIVE

## 2021-09-14 MED ORDER — TERBUTALINE SULFATE 1 MG/ML IJ SOLN
0.2500 mg | Freq: Once | INTRAMUSCULAR | Status: AC | PRN
  Administered 2021-09-14: 0.25 mg via SUBCUTANEOUS
  Filled 2021-09-14: qty 1

## 2021-09-14 MED ORDER — PHENYLEPHRINE 40 MCG/ML (10ML) SYRINGE FOR IV PUSH (FOR BLOOD PRESSURE SUPPORT): 80.0000 ug | PREFILLED_SYRINGE | INTRAVENOUS | Status: DC | PRN

## 2021-09-14 MED ORDER — DIPHENHYDRAMINE HCL 50 MG/ML IJ SOLN: 12.5000 mg | INTRAMUSCULAR | Status: DC | PRN

## 2021-09-14 MED ORDER — LACTATED RINGERS IV SOLN
INTRAVENOUS | Status: DC
  Administered 2021-09-15: 999 mL via INTRAVENOUS

## 2021-09-14 MED ORDER — LACTATED RINGERS IV SOLN
500.0000 mL | Freq: Once | INTRAVENOUS | Status: AC
  Administered 2021-09-14: 500 mL via INTRAVENOUS

## 2021-09-14 MED ORDER — OXYTOCIN-SODIUM CHLORIDE 30-0.9 UT/500ML-% IV SOLN
1.0000 m[IU]/min | INTRAVENOUS | Status: DC
  Administered 2021-09-14: 2 m[IU]/min via INTRAVENOUS
  Filled 2021-09-14: qty 500

## 2021-09-14 MED ORDER — PHENYLEPHRINE 40 MCG/ML (10ML) SYRINGE FOR IV PUSH (FOR BLOOD PRESSURE SUPPORT)
80.0000 ug | PREFILLED_SYRINGE | INTRAVENOUS | Status: DC | PRN
  Filled 2021-09-14: qty 10

## 2021-09-14 MED ORDER — OXYCODONE-ACETAMINOPHEN 5-325 MG PO TABS: 1.0000 | ORAL_TABLET | ORAL | Status: DC | PRN

## 2021-09-14 MED ORDER — SOD CITRATE-CITRIC ACID 500-334 MG/5ML PO SOLN: 30.0000 mL | ORAL | Status: DC | PRN

## 2021-09-14 MED ORDER — FENTANYL-BUPIVACAINE-NACL 0.5-0.125-0.9 MG/250ML-% EP SOLN
12.0000 mL/h | EPIDURAL | Status: DC | PRN
  Administered 2021-09-14: 10 mL/h via EPIDURAL
  Filled 2021-09-14 (×2): qty 250

## 2021-09-14 MED ORDER — OXYTOCIN BOLUS FROM INFUSION
333.0000 mL | Freq: Once | INTRAVENOUS | Status: AC
  Administered 2021-09-15: 333 mL via INTRAVENOUS

## 2021-09-14 MED ORDER — EPHEDRINE 5 MG/ML INJ: 10.0000 mg | INTRAVENOUS | Status: DC | PRN

## 2021-09-14 MED ORDER — MISOPROSTOL 25 MCG QUARTER TABLET: 25.0000 ug | ORAL_TABLET | ORAL | Status: DC | PRN

## 2021-09-14 MED ORDER — LACTATED RINGERS AMNIOINFUSION: INTRAVENOUS | Status: DC

## 2021-09-14 MED ORDER — LIDOCAINE HCL (PF) 1 % IJ SOLN: 30.0000 mL | INTRAMUSCULAR | Status: DC | PRN

## 2021-09-14 MED ORDER — LACTATED RINGERS IV SOLN
500.0000 mL | INTRAVENOUS | Status: DC | PRN
  Administered 2021-09-14 (×3): 500 mL via INTRAVENOUS

## 2021-09-14 MED ORDER — LIDOCAINE HCL (PF) 1 % IJ SOLN
INTRAMUSCULAR | Status: DC | PRN
  Administered 2021-09-14: 5 mL via EPIDURAL

## 2021-09-14 MED ORDER — ACETAMINOPHEN 325 MG PO TABS: 650.0000 mg | ORAL_TABLET | ORAL | Status: DC | PRN

## 2021-09-14 MED ORDER — FENTANYL CITRATE (PF) 100 MCG/2ML IJ SOLN
50.0000 ug | INTRAMUSCULAR | Status: DC | PRN
  Administered 2021-09-14 (×3): 100 ug via INTRAVENOUS
  Filled 2021-09-14 (×3): qty 2

## 2021-09-14 MED ORDER — OXYCODONE-ACETAMINOPHEN 5-325 MG PO TABS: 2.0000 | ORAL_TABLET | ORAL | Status: DC | PRN

## 2021-09-14 MED ORDER — ONDANSETRON HCL 4 MG/2ML IJ SOLN
4.0000 mg | Freq: Four times a day (QID) | INTRAMUSCULAR | Status: DC | PRN
  Administered 2021-09-14: 4 mg via INTRAVENOUS
  Filled 2021-09-14: qty 2

## 2021-09-14 NOTE — Anesthesia Preprocedure Evaluation (Signed)
Anesthesia Evaluation  Patient identified by MRN, date of birth, ID band Patient awake    Reviewed: Allergy & Precautions, H&P , NPO status , Patient's Chart, lab work & pertinent test results  Airway Mallampati: II  TM Distance: >3 FB Neck ROM: Full    Dental no notable dental hx.    Pulmonary neg pulmonary ROS,    Pulmonary exam normal breath sounds clear to auscultation       Cardiovascular negative cardio ROS Normal cardiovascular exam Rhythm:Regular Rate:Normal     Neuro/Psych negative neurological ROS  negative psych ROS   GI/Hepatic negative GI ROS, Neg liver ROS,   Endo/Other  negative endocrine ROS  Renal/GU negative Renal ROS  negative genitourinary   Musculoskeletal negative musculoskeletal ROS (+)   Abdominal   Peds negative pediatric ROS (+)  Hematology negative hematology ROS (+)   Anesthesia Other Findings   Reproductive/Obstetrics negative OB ROS                             Anesthesia Physical Anesthesia Plan  ASA: 2  Anesthesia Plan: Epidural   Post-op Pain Management:    Induction:   PONV Risk Score and Plan: 2 and Treatment may vary due to age or medical condition  Airway Management Planned: Natural Airway  Additional Equipment: None  Intra-op Plan:   Post-operative Plan:   Informed Consent: I have reviewed the patients History and Physical, chart, labs and discussed the procedure including the risks, benefits and alternatives for the proposed anesthesia with the patient or authorized representative who has indicated his/her understanding and acceptance.       Plan Discussed with: Anesthesiologist  Anesthesia Plan Comments:         Anesthesia Quick Evaluation

## 2021-09-14 NOTE — Anesthesia Procedure Notes (Signed)
Epidural Patient location during procedure: OB Start time: 09/14/2021 2:44 PM End time: 09/14/2021 2:54 PM  Staffing Anesthesiologist: Mellody Dance, MD Performed: anesthesiologist   Preanesthetic Checklist Completed: patient identified, IV checked, site marked, risks and benefits discussed, monitors and equipment checked, pre-op evaluation and timeout performed  Epidural Patient position: sitting Prep: DuraPrep Patient monitoring: heart rate, cardiac monitor, continuous pulse ox and blood pressure Approach: midline Location: L2-L3 Injection technique: LOR saline  Needle:  Needle type: Tuohy  Needle gauge: 17 G Needle length: 9 cm Needle insertion depth: 6 cm Catheter type: closed end flexible Catheter size: 20 Guage Catheter at skin depth: 11 cm Test dose: negative and Other  Assessment Events: blood not aspirated, injection not painful, no injection resistance and negative IV test  Additional Notes Informed consent obtained prior to proceeding including risk of failure, 1% risk of PDPH, risk of minor discomfort and bruising.  Discussed rare but serious complications including epidural abscess, permanent nerve injury, epidural hematoma.  Discussed alternatives to epidural analgesia and patient desires to proceed.  Timeout performed pre-procedure verifying patient name, procedure, and platelet count.  Patient tolerated procedure well.

## 2021-09-14 NOTE — Progress Notes (Signed)
Labor Progress Note Nichole Bauer is a 21 y.o. G1P0000 at [redacted]w[redacted]d presented for IOL d/t pd.   S: Patient is comfortable with epidural no complaints.  O:  BP 122/83   Pulse 87   Temp 98.9 F (37.2 C) (Axillary)   Resp 16   LMP 12/06/2020 (Approximate)   SpO2 100%  EFM: 130/mod/15x15/early  CVE: Dilation: 6 Effacement (%): 90 Station: 0 Presentation: Vertex Exam by:: Criss Rosales, MD   A&P: 20 y.o. G1P0000 [redacted]w[redacted]d  #Labor: Called to the room for prolonged decel at 0128. Pitocin was stopped and patient was given a dose of terbutaline. Fetal heart tracing now category 1. Will wait for 30 min of a good tracing and resume pitocin.  #Pain: Epidural  #FWB: Cat I currently  #GBS negative  Trula Slade, MD PGY-2 2:00 AM

## 2021-09-14 NOTE — H&P (Addendum)
OBSTETRIC ADMISSION HISTORY AND PHYSICAL  Nichole Bauer is a 20 y.o. female G1P0000 with IUP at [redacted]w[redacted]d by LMP presenting for IOL for postdates. Contractions started yesterday. Have become more regular and painful. About 2-3 min apart. She reports +FMs, No LOF, no VB, no blurry vision, headaches or peripheral edema, and RUQ pain.  She plans on both feeding. She declines birth control. She received her prenatal care at [facility]  Dating: By LMP --->  Estimated Date of Delivery: 09/12/21  Sono:   06/03/21 @[redacted]w[redacted]d , CWD, normal anatomy, cephalic presentation, 788g, 26% EFW  Prenatal History/Complications:  -Chlamydia infection affecting 2nd trimester, TOC neg 05/2021 -[facility] Past Medical History: Past Medical History:  Diagnosis Date   Medical history non-contributory    Past Surgical History: Past Surgical History:  Procedure Laterality Date   NO PAST SURGERIES     Obstetrical History: OB History     Gravida  1   Para  0   Term  0   Preterm  0   AB  0   Living  0      SAB  0   IAB  0   Ectopic  0   Multiple  0   Live Births  0          Social History Social History   Socioeconomic History   Marital status: Single    Spouse name: Not on file   Number of children: Not on file   Years of education: Not on file   Highest education level: Not on file  Occupational History   Not on file  Tobacco Use   Smoking status: Never   Smokeless tobacco: Never  Vaping Use   Vaping Use: Never used  Substance and Sexual Activity   Alcohol use: Not Currently   Drug use: Never   Sexual activity: Yes    Birth control/protection: Condom  Other Topics Concern   Not on file  Social History Narrative   Not on file   Social Determinants of Health   Financial Resource Strain: Not on file  Food Insecurity: No Food Insecurity   Worried About Running Out of Food in the Last Year: Never true   Ran Out of Food in the Last Year: Never true  Transportation Needs: No  Transportation Needs   Lack of Transportation (Medical): No   Lack of Transportation (Non-Medical): No  Physical Activity: Not on file  Stress: Not on file  Social Connections: Not on file    Family History: History reviewed. No pertinent family history.  Allergies: No Known Allergies  Medications Prior to Admission  Medication Sig Dispense Refill Last Dose   ferrous sulfate 325 (65 FE) MG tablet Take 1 tablet (325 mg total) by mouth every other day. 30 tablet 3 Past Week   Prenatal Vit-Fe Fumarate-FA (PREPLUS) 27-1 MG TABS Take 1 tablet by mouth daily. 30 tablet 13 Past Week   Blood Pressure Monitoring DEVI 1 each by Does not apply route once a week. (Patient not taking: Reported on 08/23/2021) 1 each 0    docusate calcium (SURFAK) 240 MG capsule Take 1 capsule (240 mg total) by mouth daily. (Patient not taking: Reported on 08/23/2021) 30 capsule 2    Review of Systems  All systems reviewed and negative except as stated in HPI  Blood pressure 113/85, pulse 89, temperature 98.6 F (37 C), temperature source Oral, last menstrual period 12/06/2020. General appearance: alert and cooperative Lungs: clear to auscultation bilaterally Heart: regular rate and rhythm Abdomen:  soft, non-tender; bowel sounds normal Extremities: Homans sign is negative, no sign of DVT Presentation: cephalic Fetal monitoring Baseline: 140 bpm, +accels, no decels Uterine activityFrequency: Every 2-3 minutes Dilation: 1.5 Effacement (%): 50 Station: -2 Exam by:: Dr. Criss Rosales  Prenatal labs: ABO, Rh: --/--/O POS (12/07 0615) Antibody: NEG (12/07 0615) Rubella: 7.58 (07/13 1150) RPR: Non Reactive (10/06 1159)  HBsAg: Negative (07/13 1150)  HIV: Non Reactive (10/06 1159)  GBS: Negative/-- (11/15 1120)  2 hr Glucola normal Genetic screening  low risk Anatomy US normal  Prenatal Transfer Tool  Maternal Diabetes: No Genetic Screening: Normal Maternal Ultrasounds/Referrals: Normal Fetal Ultrasounds or  other Referrals:  None Maternal Substance Abuse:  No Significant Maternal Medications:  None Significant Maternal Lab Results: Group B Strep negative  Results for orders placed or performed during the hospital encounter of 09/14/21 (from the past 24 hour(s))  CBC   Collection Time: 09/14/21  6:15 AM  Result Value Ref Range   WBC 10.8 (H) 4.0 - 10.5 K/uL   RBC 4.22 3.87 - 5.11 MIL/uL   Hemoglobin 12.4 12.0 - 15.0 g/dL   HCT 69.6 78.9 - 38.1 %   MCV 94.1 80.0 - 100.0 fL   MCH 29.4 26.0 - 34.0 pg   MCHC 31.2 30.0 - 36.0 g/dL   RDW 01.7 (H) 51.0 - 25.8 %   Platelets 221 150 - 400 K/uL   nRBC 0.0 0.0 - 0.2 %  Type and screen   Collection Time: 09/14/21  6:15 AM  Result Value Ref Range   ABO/RH(D) O POS    Antibody Screen NEG    Sample Expiration      09/17/2021,2359 Performed at [facility], [address] [place], [place] [address]     Patient Active Problem List   Diagnosis Date Noted   Post-dates pregnancy 09/14/2021   Abnormal vaginal Pap smear 09/06/2021   Iron deficiency anemia 07/15/2021   Chlamydia infection affecting pregnancy in second trimester 04/20/2021   Supervision of other normal pregnancy, antepartum 04/06/2021   Assessment/Plan:  [name] is a 20 y.o. G1P0000 at [redacted]w[redacted]d here for IOL for postdates  #Labor: Patient with good contraction pattern and made some cervical change from office appointment 2 days ago. Will plan to monitor for 4h and expectantly manage since painfully contracting. If no change or still needing further effacement will cervical ripening with FB and/or cytotec at that time. #Pain: Epidural, IV pain meds on request #FWB: Cat 1 #ID: GBS neg #MOF: both #MOC: declines #Circ: yes  Trula Slade, MD  09/14/2021, 7:14 AM  GME ATTESTATION:  I saw and evaluated the patient. I agree with the findings and the plan of care as documented in the resident's note and have made all necessary edits  Warner Mccreedy, MD, MPH OB Fellow,  Faculty Practice [facility], Center for [facility] 09/14/2021 7:30 AM

## 2021-09-14 NOTE — Progress Notes (Signed)
Patient ID: Nichole Bauer, female   DOB: Jul 27, 2001, 20 y.o.   MRN: [id number]  CTSP for FHR decel to 70s x total of 4 mins; FHR prior was 120-130s; position changes used and Pit was stopped (had been on 30mu/min); cx 5/C/vtx -2; Terb dose given and IUPC placed with amnioinfusion started; FHR up to baseline currently. Will leave Pit off x and then reeval for starting back.   [name] CNM 09/14/2021 7:43 PM

## 2021-09-14 NOTE — Progress Notes (Signed)
Labor Progress Note Nichole Bauer is a 20 y.o. G1P0000 at [redacted]w[redacted]d presented for IOL d/t pd.   S: Doing well now with epidural in place per RN. Recently had SROM with heavy meconium.   O:  BP 109/76   Pulse 71   Temp 97.7 F (36.5 C) (Axillary)   Resp 18   LMP 12/06/2020 (Approximate)   SpO2 100%  EFM: 130/mod/15x15/early  CVE: Dilation: 5 Effacement (%): 100 Station: -3 Presentation: Vertex Exam by:: Ileana Ladd, RN   A&P: 20 y.o. G1P0000 [redacted]w[redacted]d  #Labor: Progressing well now recently s/p SROM, will continue to titrate pit as tolerated. Plan for delivery call when time d/t heavy meconium.  #Pain: Epidural  #FWB: Cat I currently  #GBS negative   [name], DO 7:03 PM

## 2021-09-14 NOTE — Progress Notes (Signed)
Labor Progress Note Nichole Bauer is a 20 y.o. G1P0000 at [redacted]w[redacted]d presented for a pd IOL   S: Doing well but uncomfortable with contractions.   O:  BP (!) 105/57 Comment: pt on side; asymptomatic  Pulse 70   Temp 98.2 F (36.8 C) (Oral)   Resp 18   LMP 12/06/2020 (Approximate)  EFM: 130/mod/15x15/none  CVE: Dilation: 3 Effacement (%): 90 Station: -3 Presentation: Vertex Exam by:: Leticia Penna, MD   A&P: 20 y.o. G1P0000 [redacted]w[redacted]d  #Labor: Initially was planning to place FB, however on check was 3/90. Contracting about every 7-8 minutes, will start pit 2x2 and hopeful she may not need much to be adequate.  #Pain: Plans for epidural  #FWB: Cat 1  #GBS negative   [name], DO 1:17 PM

## 2021-09-15 ENCOUNTER — Encounter: Payer: Medicaid Other | Admitting: Certified Nurse Midwife

## 2021-09-15 ENCOUNTER — Encounter (HOSPITAL_COMMUNITY): Payer: Self-pay | Admitting: Family Medicine

## 2021-09-15 DIAGNOSIS — O1415 Severe pre-eclampsia, complicating the puerperium: Secondary | ICD-10-CM | POA: Diagnosis not present

## 2021-09-15 DIAGNOSIS — Z3A40 40 weeks gestation of pregnancy: Secondary | ICD-10-CM | POA: Diagnosis not present

## 2021-09-15 DIAGNOSIS — O411230 Chorioamnionitis, third trimester, not applicable or unspecified: Secondary | ICD-10-CM | POA: Diagnosis not present

## 2021-09-15 DIAGNOSIS — O480 Post-term pregnancy: Secondary | ICD-10-CM | POA: Diagnosis not present

## 2021-09-15 DIAGNOSIS — O411290 Chorioamnionitis, unspecified trimester, not applicable or unspecified: Secondary | ICD-10-CM | POA: Diagnosis present

## 2021-09-15 LAB — COMPREHENSIVE METABOLIC PANEL
ALT: 10 U/L (ref 0–44)
AST: 37 U/L (ref 15–41)
Albumin: 1.9 g/dL — ABNORMAL LOW (ref 3.5–5.0)
Alkaline Phosphatase: 130 U/L — ABNORMAL HIGH (ref 38–126)
Anion gap: 12 (ref 5–15)
BUN: 10 mg/dL (ref 6–20)
CO2: 19 mmol/L — ABNORMAL LOW (ref 22–32)
Calcium: 8.5 mg/dL — ABNORMAL LOW (ref 8.9–10.3)
Chloride: 102 mmol/L (ref 98–111)
Creatinine, Ser: 1.65 mg/dL — ABNORMAL HIGH (ref 0.44–1.00)
GFR, Estimated: 45 mL/min — ABNORMAL LOW (ref >60)
Glucose, Bld: 101 mg/dL — ABNORMAL HIGH (ref 70–99)
Potassium: 3.5 mmol/L (ref 3.5–5.1)
Sodium: 133 mmol/L — ABNORMAL LOW (ref 135–145)
Total Bilirubin: 1.3 mg/dL — ABNORMAL HIGH (ref 0.3–1.2)
Total Protein: 6 g/dL — ABNORMAL LOW (ref 6.5–8.1)

## 2021-09-15 LAB — CBC
HCT: 37.6 % (ref 36.0–46.0)
Hemoglobin: 12.5 g/dL (ref 12.0–15.0)
MCH: 30.5 pg (ref 26.0–34.0)
MCHC: 33.2 g/dL (ref 30.0–36.0)
MCV: 91.7 fL (ref 80.0–100.0)
Platelets: 182 10*3/uL (ref 150–400)
RBC: 4.1 MIL/uL (ref 3.87–5.11)
RDW: 20.5 % — ABNORMAL HIGH (ref 11.5–15.5)
WBC: 24.2 10*3/uL — ABNORMAL HIGH (ref 4.0–10.5)
nRBC: 0 % (ref 0.0–0.2)

## 2021-09-15 LAB — PROTEIN / CREATININE RATIO, URINE
Creatinine, Urine: 23.42 mg/dL
Protein Creatinine Ratio: 1.41 mg/mg{Cre} — ABNORMAL HIGH (ref 0.00–0.15)
Total Protein, Urine: 33 mg/dL

## 2021-09-15 MED ORDER — ACETAMINOPHEN 500 MG PO TABS: 1000.0000 mg | ORAL_TABLET | Freq: Once | ORAL | Status: DC

## 2021-09-15 MED ORDER — OXYTOCIN-SODIUM CHLORIDE 30-0.9 UT/500ML-% IV SOLN: 1.0000 m[IU]/min | INTRAVENOUS | Status: DC

## 2021-09-15 MED ORDER — OXYTOCIN BOLUS FROM INFUSION: 333.0000 mL | Freq: Once | INTRAVENOUS | Status: DC

## 2021-09-15 MED ORDER — METHYLERGONOVINE MALEATE 0.2 MG/ML IJ SOLN
INTRAMUSCULAR | Status: AC
  Administered 2021-09-15: 0.2 mg via INTRAMUSCULAR
  Filled 2021-09-15: qty 1

## 2021-09-15 MED ORDER — ONDANSETRON HCL 4 MG/2ML IJ SOLN: 4.0000 mg | INTRAMUSCULAR | Status: DC | PRN

## 2021-09-15 MED ORDER — BENZOCAINE-MENTHOL 20-0.5 % EX AERO: 1.0000 "application " | INHALATION_SPRAY | CUTANEOUS | Status: DC | PRN

## 2021-09-15 MED ORDER — FUROSEMIDE 40 MG PO TABS
20.0000 mg | ORAL_TABLET | Freq: Two times a day (BID) | ORAL | Status: DC
  Administered 2021-09-16 – 2021-09-17 (×3): 20 mg via ORAL
  Filled 2021-09-15 (×3): qty 1

## 2021-09-15 MED ORDER — ACETAMINOPHEN 325 MG PO TABS
650.0000 mg | ORAL_TABLET | ORAL | Status: DC | PRN
  Administered 2021-09-15 – 2021-09-16 (×2): 650 mg via ORAL
  Filled 2021-09-15 (×2): qty 2

## 2021-09-15 MED ORDER — MAGNESIUM SULFATE 40 GM/1000ML IV SOLN
1.0000 g/h | INTRAVENOUS | Status: AC
  Administered 2021-09-16: 1 g/h via INTRAVENOUS
  Filled 2021-09-15: qty 1000

## 2021-09-15 MED ORDER — MAGNESIUM SULFATE BOLUS VIA INFUSION
4.0000 g | Freq: Once | INTRAVENOUS | Status: AC
  Administered 2021-09-16: 4 g via INTRAVENOUS
  Filled 2021-09-15: qty 1000

## 2021-09-15 MED ORDER — ONDANSETRON HCL 4 MG PO TABS: 4.0000 mg | ORAL_TABLET | ORAL | Status: DC | PRN

## 2021-09-15 MED ORDER — DIPHENHYDRAMINE HCL 25 MG PO CAPS: 25.0000 mg | ORAL_CAPSULE | Freq: Four times a day (QID) | ORAL | Status: DC | PRN

## 2021-09-15 MED ORDER — IBUPROFEN 600 MG PO TABS
600.0000 mg | ORAL_TABLET | Freq: Four times a day (QID) | ORAL | Status: DC
  Administered 2021-09-15: 600 mg via ORAL
  Filled 2021-09-15: qty 1

## 2021-09-15 MED ORDER — METHYLERGONOVINE MALEATE 0.2 MG/ML IJ SOLN: 0.2000 mg | Freq: Once | INTRAMUSCULAR | Status: AC

## 2021-09-15 MED ORDER — WITCH HAZEL-GLYCERIN EX PADS: 1.0000 "application " | MEDICATED_PAD | CUTANEOUS | Status: DC | PRN

## 2021-09-15 MED ORDER — GENTAMICIN SULFATE 40 MG/ML IJ SOLN
5.0000 mg/kg | INTRAMUSCULAR | Status: DC
Start: 2021-09-15 — End: 2021-09-15
  Administered 2021-09-15: 350 mg via INTRAVENOUS
  Filled 2021-09-15: qty 8.75

## 2021-09-15 MED ORDER — PRENATAL MULTIVITAMIN CH
1.0000 | ORAL_TABLET | Freq: Every day | ORAL | Status: DC
  Filled 2021-09-15: qty 1

## 2021-09-15 MED ORDER — TRANEXAMIC ACID-NACL 1000-0.7 MG/100ML-% IV SOLN: 1000.0000 mg | Freq: Once | INTRAVENOUS | Status: AC

## 2021-09-15 MED ORDER — ZOLPIDEM TARTRATE 5 MG PO TABS: 5.0000 mg | ORAL_TABLET | Freq: Every evening | ORAL | Status: DC | PRN

## 2021-09-15 MED ORDER — DIBUCAINE (PERIANAL) 1 % EX OINT: 1.0000 "application " | TOPICAL_OINTMENT | CUTANEOUS | Status: DC | PRN

## 2021-09-15 MED ORDER — TERBUTALINE SULFATE 1 MG/ML IJ SOLN
0.2500 mg | Freq: Once | INTRAMUSCULAR | Status: AC
  Administered 2021-09-15: 0.25 mg via SUBCUTANEOUS

## 2021-09-15 MED ORDER — SODIUM CHLORIDE 0.9 % IV SOLN
2.0000 g | Freq: Four times a day (QID) | INTRAVENOUS | Status: DC
  Administered 2021-09-15: 2 g via INTRAVENOUS
  Filled 2021-09-15: qty 2000

## 2021-09-15 MED ORDER — COCONUT OIL OIL: 1.0000 "application " | TOPICAL_OIL | Status: DC | PRN

## 2021-09-15 MED ORDER — SIMETHICONE 80 MG PO CHEW: 80.0000 mg | CHEWABLE_TABLET | ORAL | Status: DC | PRN

## 2021-09-15 MED ORDER — NIFEDIPINE ER OSMOTIC RELEASE 30 MG PO TB24: 30.0000 mg | ORAL_TABLET | Freq: Every day | ORAL | Status: DC

## 2021-09-15 MED ORDER — SODIUM CHLORIDE 0.9 % IV SOLN
2.5000 [IU]/h | INTRAVENOUS | Status: DC
Start: 2021-09-15 — End: 2021-09-17
  Filled 2021-09-15: qty 3

## 2021-09-15 MED ORDER — PIPERACILLIN-TAZOBACTAM 3.375 G IVPB
3.3750 g | Freq: Three times a day (TID) | INTRAVENOUS | Status: AC
  Administered 2021-09-15 – 2021-09-16 (×3): 3.375 g via INTRAVENOUS
  Filled 2021-09-15 (×4): qty 50

## 2021-09-15 MED ORDER — TETANUS-DIPHTH-ACELL PERTUSSIS 5-2.5-18.5 LF-MCG/0.5 IM SUSY: 0.5000 mL | PREFILLED_SYRINGE | Freq: Once | INTRAMUSCULAR | Status: DC

## 2021-09-15 MED ORDER — TRANEXAMIC ACID-NACL 1000-0.7 MG/100ML-% IV SOLN
INTRAVENOUS | Status: AC
  Administered 2021-09-15: 1000 mg via INTRAVENOUS
  Filled 2021-09-15: qty 100

## 2021-09-15 MED ORDER — TERBUTALINE SULFATE 1 MG/ML IJ SOLN
INTRAMUSCULAR | Status: AC
  Filled 2021-09-15: qty 1

## 2021-09-15 MED ORDER — SENNOSIDES-DOCUSATE SODIUM 8.6-50 MG PO TABS
2.0000 | ORAL_TABLET | Freq: Every day | ORAL | Status: DC
  Filled 2021-09-15: qty 2

## 2021-09-15 MED ORDER — LACTATED RINGERS IV SOLN: INTRAVENOUS | Status: DC

## 2021-09-15 NOTE — Discharge Summary (Signed)
Postpartum Discharge Summary     Patient Name: Nichole Bauer DOB: 2001-03-16 MRN: [id number]  Date of admission: 09/14/2021 Delivery date:09/15/2021  Delivering provider: Verita Schneiders A  Date of discharge: 09/17/2021  Admitting diagnosis: Post-dates pregnancy [O48.0] Intrauterine pregnancy: [redacted]w[redacted]d    Secondary diagnosis:  Principal Problem:   Post-dates pregnancy Active Problems:   Supervision of other normal pregnancy, antepartum   Chlamydia infection affecting pregnancy in second trimester   Iron deficiency anemia   Chorioamnionitis   Shoulder dystocia during labor and delivery   Vacuum-assisted vaginal delivery   Severe pre-eclampsia, postpartum  Additional problems: Triple I, shoulder dystocia, vacuum assisted delivery    Discharge diagnosis: Term Pregnancy Delivered                                              Post partum procedures: Magnesium Augmentation: Pitocin Complications: Intrauterine Inflammation or infection (Chorioamniotis)  Hospital course: Induction of Labor With Vaginal Delivery   20y.o. yo G1P0000 at 442w3das admitted to the hospital 09/14/2021 for induction of labor.  Indication for induction:  postterm at 4025w2d Patient had an uncomplicated labor course as follows: Membrane Rupture Time/Date: 3:24 PM ,09/14/2021   Delivery Method:Vaginal, Vacuum (Extractor)  Episiotomy: None  Lacerations:  2nd degree  Details of delivery can be found in separate delivery note.  Postpartum patient noted to have elevated BP in the severe range and labs revealed bump in serum Cr to 1.13. given magnesium Sulfate x 24 hours. Given Lasix with good diuresis. BP was quite stable, so no further BP meds given. Patient is discharged home 09/17/21 with BP f/u, pp BP remote monitoring and to complete a course of lasix.  Newborn Data: Birth date:09/15/2021  Birth time:7:19 PM  Gender:Female  Living status:Living  Apgars:0 ,7  Weight:3560 g   Magnesium Sulfate received:  No BMZ received: No Rhophylac:No MMR:No T-DaP: declined Flu: No Transfusion:No  Physical exam  Vitals:   09/17/21 0014 09/17/21 0015 09/17/21 0409 09/17/21 0753  BP: 115/66  110/69 (!) 109/56  Pulse: 62  62 71  Resp: _0 Temp: 98.1 F (36.7 C)  98.1 F (36.7 C)   TempSrc: Oral  Oral   SpO2: 100% 100% 100% 100%   General: alert, cooperative, and no distress Lochia: appropriate Uterine Fundus: firm DVT Evaluation: No evidence of DVT seen on physical exam. Labs: Lab Results  Component Value Date   WBC 30.7 (H) 09/16/2021   HGB 11.9 (L) 09/16/2021   HCT 36.0 09/16/2021   MCV 92.5 09/16/2021   PLT 177 09/16/2021   CMP Latest Ref Rng & Units 09/17/2021  Glucose 70 - 99 mg/dL 67(L)  BUN 6 - 20 mg/dL 8  Creatinine 0.44 - 1.00 mg/dL 0.89  Sodium 135 - 145 mmol/L 136  Potassium 3.5 - 5.1 mmol/L 3.4(L)  Chloride 98 - 111 mmol/L 98  CO2 22 - 32 mmol/L 29  Calcium 8.9 - 10.3 mg/dL 8.8(L)  Total Protein 6.5 - 8.1 g/dL 5.8(L)  Total Bilirubin 0.3 - 1.2 mg/dL 0.5  Alkaline Phos 38 - 126 U/L 149(H)  AST 15 - 41 U/L 37  ALT 0 - 44 U/L 14   Edinburgh Score: No flowsheet data found.   After visit meds:  Allergies as of 09/17/2021   No Known Allergies      Medication List  TAKE these medications    Blood Pressure Monitoring Devi 1 each by Does not apply route once a week.   docusate calcium 240 MG capsule Commonly known as: SURFAK Take 1 capsule (240 mg total) by mouth daily.   ferrous sulfate 325 (65 FE) MG tablet Take 1 tablet (325 mg total) by mouth every other day.   furosemide 20 MG tablet Commonly known as: LASIX Take 1 tablet (20 mg total) by mouth 2 (two) times daily.   ibuprofen 600 MG tablet Commonly known as: ADVIL Take 1 tablet (600 mg total) by mouth every 6 (six) hours as needed.   PrePLUS 27-1 MG Tabs Take 1 tablet by mouth daily.         Discharge home in stable condition Infant Feeding: Bottle Infant  Disposition:NICU Discharge instruction: per After Visit Summary and Postpartum booklet. Activity: Advance as tolerated. Pelvic rest for 6 weeks.  Diet: routine diet Future Appointments:No future appointments. Follow up Visit:  [place] [facility] [place] at [facility] Follow up in 1 week(s).   Specialty: Obstetrics and Gynecology Why: blood pressure check - 4 weeks for postpartum check Contact information: [place] [address] [phone]                Message sent to [facility] on 09/15/21: Please schedule this patient for a In person postpartum visit in 6 weeks with the following provider: Any provider. Additional Postpartum F/U: n/a   Low risk pregnancy complicated by:  chlamydia x 2 in pregnancy Delivery mode:  Vaginal, Vacuum (Extractor)  Anticipated Birth Control:   declines   09/17/2021 Donnamae Jude, MD

## 2021-09-15 NOTE — Progress Notes (Signed)
Nichole Bauer is a 20 y.o. G1P0000 at [redacted]w[redacted]d admitted for induction of labor due to postterm elective.  Subjective: Pt comfortable with epidural, feeling some low abdomen pressure with some contractions. Family in room for support.  Objective: BP 125/85   Pulse (!) 111   Temp (!) 100.7 F (38.2 C) (Oral)   Resp 18   LMP 12/06/2020 (Approximate)   SpO2 99%  I/O last 3 completed shifts: In: -  Out: 1375 [Urine:1375] No intake/output data recorded.  FHT:  FHR: 155 bpm, variability: moderate,  accelerations:  Present,  decelerations:  Present occasional variables UC:   regular, every 2 minutes SVE:   Dilation: 10 Effacement (%): 100 Station: Plus 2 Exam by:: Sharen Counter, CNM  Labs: Lab Results  Component Value Date   WBC 10.8 (H) 09/14/2021   HGB 12.4 09/14/2021   HCT 39.7 09/14/2021   MCV 94.1 09/14/2021   PLT 221 09/14/2021    Assessment / Plan: Induction of labor due to postdates/elective Triple I with fever 100.7 and fetal tachycardia  Labor:  Pt complete with development of Triple I.  Ampicillin/Gentamycin and Tylenol ordered.  Pt test pushed and pushing well.  Preeclampsia:   n/a Fetal Wellbeing:  Category I Pain Control:  Epidural I/D:   GBS negative Anticipated MOD:  NSVD  Sharen Counter 09/15/2021, 4:32 PM

## 2021-09-15 NOTE — Progress Notes (Signed)
Labor Progress Note Nichole Bauer is a 20 y.o. G1P0000 at [redacted]w[redacted]d presented for IOL d/t pd.   S: Patient is comfortable with epidural no complaints.  O:  BP 127/87   Pulse 96   Temp 98.7 F (37.1 C) (Oral)   Resp 16   LMP 12/06/2020 (Approximate)   SpO2 100%  EFM: 150/mod/15x15/no decels  CVE: Dilation: 6 Effacement (%): 90 Station: 0 Presentation: Vertex Exam by:: Criss Rosales, MD  A&P: 20 y.o. G1P0000 [redacted]w[redacted]d  #Labor: cat 1 tracing after pit break. restarted Pitocin #Pain: Epidural  #FWB: Cat I currently  #GBS negative  Trula Slade, MD PGY-2 3:54 AM

## 2021-09-15 NOTE — Progress Notes (Addendum)
[facility]ulty [facility] OB/GYN Attending Note  Subjective:  Patient reports she is doing okay after delivery.  Patient denies any headaches, visual symptoms, RUQ/epigastric pain or other concerning symptoms. Reports baby is doing well in NICU    Objective:  Blood pressure (!) 141/74, pulse 70, temperature 98.5 F (36.9 C), temperature source Oral, resp. rate 18, last menstrual period 12/06/2020, SpO2 98 %, unknown if currently breastfeeding.  Patient Vitals for the past 24 hrs:  BP Temp Temp src Pulse Resp SpO2  09/15/21 2308 (!) 141/74 98.5 F (36.9 C) Oral 70 18 98 %  09/15/21 2156 (!) 150/78 98.5 F (36.9 C) Oral 65 16 100 %  09/15/21 2130 (!) 146/77 98.6 F (37 C) Oral 66 -- --  09/15/21 2100 (!) 147/88 -- -- 75 -- --  09/15/21 2034 (!) 146/91 -- -- 73 -- --  09/15/21 2030 (!) 154/88 -- -- 75 -- --  09/15/21 2015 (!) 149/83 -- -- 79 -- --  09/15/21 2000 (!) 142/84 -- -- 82 -- --  09/15/21 1955 -- 98.3 F (36.8 C) Oral -- -- --  09/15/21 1900 135/86 -- -- 98 -- --  09/15/21 1730 132/86 98.6 F (37 C) -- (!) 121 -- --  09/15/21 1630 138/76 -- -- 98 -- --  09/15/21 1611 -- (!) 100.7 F (38.2 C) Oral -- 18 --  09/15/21 1530 125/85 -- -- (!) 111 -- --  09/15/21 1500 136/90 -- -- (!) 109 -- --  09/15/21 1430 131/81 99.9 F (37.7 C) -- (!) 111 -- --  09/15/21 1400 123/76 -- -- (!) 106 -- --  09/15/21 1245 -- 98.4 F (36.9 C) Oral -- -- --  09/15/21 1230 (!) 134/95 -- -- 90 -- --  09/15/21 1200 131/86 -- -- 84 -- --  09/15/21 1130 130/84 -- -- 81 -- --  09/15/21 1100 127/86 -- -- 77 -- --  09/15/21 1000 109/63 -- -- 80 -- --  09/15/21 0945 -- 98.3 F (36.8 C) Oral -- -- --  09/15/21 0930 (!) 115/59 -- -- 79 -- --  09/15/21 0900 115/78 -- -- 79 -- --  09/15/21 0830 136/85 -- -- 79 -- --  09/15/21 0800 132/79 -- -- 79 -- --  09/15/21 0730 130/87 -- -- 74 -- --  09/15/21 0702 133/88 -- -- 78 -- --  09/15/21 0700 -- 98.8 F (37.1 C) Axillary -- -- --  09/15/21 0630 137/88  -- -- 81 -- 99 %  09/15/21 0530 125/80 -- -- 76 -- 100 %  09/15/21 0456 -- 99 F (37.2 C) Axillary -- -- --  09/15/21 0430 128/81 -- -- 86 -- 100 %  09/15/21 0415 126/79 -- -- 88 -- 99 %  09/15/21 0400 124/84 -- -- 91 -- 100 %  09/15/21 0345 127/87 -- -- 96 -- 100 %  09/15/21 0330 131/87 -- -- 94 -- 100 %  09/15/21 0315 131/85 98.7 F (37.1 C) Oral 98 -- 100 %  09/15/21 0215 114/62 -- -- (!) 102 -- 100 %  09/15/21 0200 (!) 112/48 -- -- (!) 102 -- 100 %  09/15/21 0130 122/83 -- -- 87 -- 100 %  09/15/21 0100 126/85 -- -- 94 -- 100 %  09/15/21 0030 116/75 98.9 F (37.2 C) Axillary 92 -- 100 %  09/15/21 0000 130/89 -- -- 88 -- 100 %   Good UOP since delivery.  Gen: NAD HENT: Normocephalic, atraumatic Lungs: Normal respiratory effort Heart: Regular rate noted Abdomen: NT,gravid fundus, soft  Cervix: Deferred Ext: 2+ DTRs, mild BLE edema, no cyanosis, negative Homan's sign  Labs: Results for orders placed or performed during the hospital encounter of 09/14/21 (from the past 24 hour(s))  Protein / creatinine ratio, urine     Status: Abnormal   Collection Time: 09/15/21  8:34 PM  Result Value Ref Range   Creatinine, Urine 23.42 mg/dL   Total Protein, Urine 33 mg/dL   Protein Creatinine Ratio 1.41 (H) 0.00 - 0.15 mg/mg[Cre]  CBC     Status: Abnormal   Collection Time: 09/15/21  9:18 PM  Result Value Ref Range   WBC 24.2 (H) 4.0 - 10.5 K/uL   RBC 4.10 3.87 - 5.11 MIL/uL   Hemoglobin 12.5 12.0 - 15.0 g/dL   HCT 42.5 95.6 - 38.7 %   MCV 91.7 80.0 - 100.0 fL   MCH 30.5 26.0 - 34.0 pg   MCHC 33.2 30.0 - 36.0 g/dL   RDW 56.4 (H) 33.2 - 95.1 %   Platelets 182 150 - 400 K/uL   nRBC 0.0 0.0 - 0.2 %  Comprehensive metabolic panel     Status: Abnormal   Collection Time: 09/15/21  9:18 PM  Result Value Ref Range   Sodium 133 (L) 135 - 145 mmol/L   Potassium 3.5 3.5 - 5.1 mmol/L   Chloride 102 98 - 111 mmol/L   CO2 19 (L) 22 - 32 mmol/L   Glucose, Bld 101 (H) 70 - 99 mg/dL   BUN 10  6 - 20 mg/dL   Creatinine, Ser 8.84 (H) 0.44 - 1.00 mg/dL   Calcium 8.5 (L) 8.9 - 10.3 mg/dL   Total Protein 6.0 (L) 6.5 - 8.1 g/dL   Albumin 1.9 (L) 3.5 - 5.0 g/dL   AST 37 15 - 41 U/L   ALT 10 0 - 44 U/L   Alkaline Phosphatase 130 (H) 38 - 126 U/L   Total Bilirubin 1.3 (H) 0.3 - 1.2 mg/dL   GFR, Estimated 45 (L) >60 mL/min   Anion gap 12 5 - 15    Assessment & Plan:  20 y.o. G1P1001 PPD#0 s/p VAVD at [redacted]w[redacted]d, now with postpartum severe preeclampsia. - Talked to patient about this diagnosis, emphasized need for seizure prophylaxis and BP control - Ordered magnesium sulfate 4 g IV load, and 1 g/hr x 24 hours. May need to adjust/discontinue if Cr increases further. Will monitor UOP - Procardia XL 30 mg po qd and Lasix 20 mg po bid ordered, will adjust regimen as needed. - NSAIDs discontinued for now. - Will recheck labs in morning Continue routine postpartum care and close observation.   Jaynie Collins, MD, FACOG Obstetrician & Gynecologist, [facility] [employer], [facility]

## 2021-09-15 NOTE — Progress Notes (Signed)
Patient ID: Nichole Bauer, female   DOB: Oct 04, 2001, 20 y.o.   MRN: [id number]  Sitting up in throne position- epidural working well; Pit was at 107mu/min but recently stopped due to FHR variables and concern over increased uterine resting tone  BP 133/88, P 78 FHR 120-130s, +accels, avg LTV, some variables Ctx q 3-5 mins; abd consistently tight Cx 7-8/C/vtx -1; unable to get fluid return during exam  IUP@40 .3wks Protracted active phase due to inadequate ctx  Amnioinfusion stopped due to increased uterine resting tone from lack of fluid return; IUPC adjusted but doesn't need replaced; has had cervical change despite inadequate MVUs- will watch over next 30-19mins and then most likely will start Pit and will try maternal positional maneuvers to get some fluid out  Nichole Bauer CNM 09/15/2021

## 2021-09-15 NOTE — Progress Notes (Signed)
Nichole Bauer is a 20 y.o. G1P0000 at [redacted]w[redacted]d by admitted for postterm, elective IOL.  Subjective:   Objective: BP (!) 134/95   Pulse 90   Temp 98.4 F (36.9 C) (Oral)   Resp 16   LMP 12/06/2020 (Approximate)   SpO2 99%  I/O last 3 completed shifts: In: -  Out: 1375 [Urine:1375] No intake/output data recorded.  FHT:  FHR: 135 bpm, variability: moderate,  accelerations:  Present,  decelerations:  Present early and isolated variable UC:   regular, every 3-4 minutes SVE:   Dilation: 9 Effacement (%): 100 Station: -1 Exam by:: Sharen Counter, CNM  Labs: Lab Results  Component Value Date   WBC 10.8 (H) 09/14/2021   HGB 12.4 09/14/2021   HCT 39.7 09/14/2021   MCV 94.1 09/14/2021   PLT 221 09/14/2021    Assessment / Plan: Induction of labor due to postterm/polyhydramnios  Labor: Progressing normally Preeclampsia:   n/a Fetal Wellbeing:  Category I Pain Control:  Epidural I/D:   GBS neg Anticipated MOD:  NSVD  Nichole Bauer 09/15/2021, 1:50 PM

## 2021-09-15 NOTE — Progress Notes (Signed)
Pharmacy Antibiotic Note  Nichole Bauer is a 20 y.o. female admitted on 09/14/2021 for elective IOL due to postdate at [redacted]w[redacted]d .  Pharmacy has been consulted for Gentamicin dosing for Triple I.  Plan: Gentamicin 5mg /kg (350mg ) IV q24h Will continue to follow     Temp (24hrs), Avg:98.9 F (37.2 C), Min:97.9 F (36.6 C), Max:100.7 F (38.2 C)  Recent Labs  Lab 09/14/21 0615  WBC 10.8*    CrCl cannot be calculated (Patient's most recent lab result is older than the maximum 21 days allowed.).    No Known Allergies  Antimicrobials this admission: Ampicillin 2 gram IV q6h 12/8 >>   Thank you for allowing pharmacy to be a part of this patient's care.  14/07/22 09/15/2021 4:23 PM

## 2021-09-15 NOTE — Progress Notes (Addendum)
Nichole Bauer is a 20 y.o. G1P0000 at [redacted]w[redacted]d by admitted for elective induction of labor/post term induction.  Subjective: Pt comfortable with epidural. Sleeping with family in room for support.  Objective: BP 136/85   Pulse 79   Temp 98.8 F (37.1 C) (Axillary)   Resp 16   LMP 12/06/2020 (Approximate)   SpO2 99%  I/O last 3 completed shifts: In: -  Out: 1375 [Urine:1375] No intake/output data recorded.  FHT:  FHR: 135 bpm, variability: moderate,  accelerations:  Present,  decelerations:  Absent UC:   regular, every 3 minutes SVE:   Dilation: 7.5 Effacement (%): 100 Station: -1 Exam by:: Philipp Deputy, CNM Amnisure off, RN uncapped IV end of amnisure and meconium stained fluid draining out on to towel. Prior to this, little fluid return from vagina and fetal head applied well to cervix, no fluid return with vaginal exam.    Labs: Lab Results  Component Value Date   WBC 10.8 (H) 09/14/2021   HGB 12.4 09/14/2021   HCT 39.7 09/14/2021   MCV 94.1 09/14/2021   PLT 221 09/14/2021    Assessment / Plan: Induction of labor due to elective,  progressing well on pitocin  Labor: Progressing normally Preeclampsia:   n/a Fetal Wellbeing:  Category I Pain Control:  Epidural I/D:   GBS neg Anticipated MOD:  NSVD  Sharen Counter 09/15/2021, 9:51 AM

## 2021-09-15 NOTE — Progress Notes (Signed)
Nichole Bauer is a 20 y.o. G1P0000 at [redacted]w[redacted]d admitted for postterm/elective IOL.  Subjective: Pt comfortable with epidural. Feeling some pressure with contractions.  Objective: BP 135/86   Pulse 98   Temp 98.6 F (37 C)   Resp 18   LMP 12/06/2020 (Approximate)   SpO2 99%  I/O last 3 completed shifts: In: -  Out: 1375 [Urine:1375] No intake/output data recorded.  FHT:  FHR: 155 bpm, variability: moderate,  accelerations:  Abscent,  decelerations:  Present periodic variables, lasting 20-30 seconds with pushing. Single prolonged deceleration, Dr Macon Large was in the room, see below.  UC:   regular, every 2 minutes SVE:   Dilation: 10 Effacement (%): 100 Station: Plus 2 Exam by:: Sharen Counter, CNM  Labs: Lab Results  Component Value Date   WBC 10.8 (H) 09/14/2021   HGB 12.4 09/14/2021   HCT 39.7 09/14/2021   MCV 94.1 09/14/2021   PLT 221 09/14/2021    Assessment / Plan: Induction of labor due to postterm Triple I Meconium stained fluid  Labor:  Pt pushing well, pushing 2+ hours, pt becoming tired.  Dr Macon Large called to room to evaluate for vacuum and discussed benefits/risks. Pt consented to use of vacuum.  FHR deceleration occurred at random, pt was not pushing while talking with MD.  FHR returned to baseline without treatment.  Preeclampsia:   n/a Fetal Wellbeing:  Category II Pain Control:  Epidural I/D:   GBS neg, on Am Anticipated MOD:   VAVD  Sharen Counter 09/15/2021, 7:36 PM   Attestation of Attending Supervision of Advanced Practice Provider (PA/CNM/NP): Evaluation and management procedures were performed by the Advanced Practice Provider under my supervision and collaboration.  I have reviewed the Advanced Practice Provider's note and chart, and I agree with the management and plan. I have also made any necessary editorial changes.  Came to the room, was counseling patient about operative vaginal delivery and prolonged FHR deceleration was noted  to occur. Risks of vacuum assistance were explained to patient and NICU team called. Proceeding with vacuum assistance immediately, please refer to delivery note for further details.   Jaynie Collins, MD, FACOG Attending Obstetrician & Gynecologist, [facility] [employer], [facility]

## 2021-09-16 LAB — MAGNESIUM: Magnesium: 3.8 mg/dL — ABNORMAL HIGH (ref 1.7–2.4)

## 2021-09-16 LAB — COMPREHENSIVE METABOLIC PANEL
ALT: 11 U/L (ref 0–44)
AST: 34 U/L (ref 15–41)
Albumin: 1.7 g/dL — ABNORMAL LOW (ref 3.5–5.0)
Alkaline Phosphatase: 108 U/L (ref 38–126)
Anion gap: 8 (ref 5–15)
BUN: 10 mg/dL (ref 6–20)
CO2: 22 mmol/L (ref 22–32)
Calcium: 8.5 mg/dL — ABNORMAL LOW (ref 8.9–10.3)
Chloride: 104 mmol/L (ref 98–111)
Creatinine, Ser: 1.13 mg/dL — ABNORMAL HIGH (ref 0.44–1.00)
GFR, Estimated: >60 mL/min (ref >60)
Glucose, Bld: 108 mg/dL — ABNORMAL HIGH (ref 70–99)
Potassium: 3.5 mmol/L (ref 3.5–5.1)
Sodium: 134 mmol/L — ABNORMAL LOW (ref 135–145)
Total Bilirubin: 0.9 mg/dL (ref 0.3–1.2)
Total Protein: 5.4 g/dL — ABNORMAL LOW (ref 6.5–8.1)

## 2021-09-16 LAB — CBC
HCT: 36 % (ref 36.0–46.0)
Hemoglobin: 11.9 g/dL — ABNORMAL LOW (ref 12.0–15.0)
MCH: 30.6 pg (ref 26.0–34.0)
MCHC: 33.1 g/dL (ref 30.0–36.0)
MCV: 92.5 fL (ref 80.0–100.0)
Platelets: 177 10*3/uL (ref 150–400)
RBC: 3.89 MIL/uL (ref 3.87–5.11)
RDW: 20.7 % — ABNORMAL HIGH (ref 11.5–15.5)
WBC: 30.7 10*3/uL — ABNORMAL HIGH (ref 4.0–10.5)
nRBC: 0 % (ref 0.0–0.2)

## 2021-09-16 NOTE — Lactation Note (Signed)
This note was copied from a baby's chart. Lactation Consultation Note  Patient Name: Nichole Bauer GBTDV'[id number] Date: 09/16/2021   Age:22 hours  Maternal Data  Mother has decided to bottle feed only. We reviewed s/s of engorgement and strategies to decrease discomfort, prn. LC provided written education and resources.  LC services are complete at this time.    Elder Negus 09/16/2021, 10:06 AM

## 2021-09-16 NOTE — Anesthesia Postprocedure Evaluation (Signed)
Anesthesia Post Note  Patient: Nichole Bauer  Procedure(s) Performed: AN AD HOC LABOR EPIDURAL     Patient location during evaluation: Mother Baby Anesthesia Type: Epidural Level of consciousness: awake and alert Pain management: pain level controlled Vital Signs Assessment: post-procedure vital signs reviewed and stable Respiratory status: spontaneous breathing, nonlabored ventilation and respiratory function stable Cardiovascular status: stable Postop Assessment: no headache, no backache and epidural receding Anesthetic complications: no   No notable events documented.  Last Vitals:  Vitals:   09/16/21 0601 09/16/21 0701  BP: 101/64 (!) 110/59  Pulse: (!) 59 68  Resp: 18   Temp: 36.9 C   SpO2:      Last Pain:  Vitals:   09/16/21 0613  TempSrc:   PainSc: 0-No pain   Pain Goal: Patients Stated Pain Goal: 0 (09/15/21 2156)                 Fanny Dance

## 2021-09-16 NOTE — Progress Notes (Signed)
[facility]ulty [facility] OB/[facility]N Attending Note  Subjective:  Patient had no sentinel events overnight.  She denies any headaches, visual symptoms, RUQ/epigastric pain or other concerning symptoms. Moderate lochia reported. Reports baby is doing well in NICU    Objective:  Blood pressure (!) 110/59, pulse 68, temperature 98.4 F (36.9 C), temperature source Oral, resp. rate 18, last menstrual period 12/06/2020, SpO2 98 %, unknown if currently breastfeeding.  Patient Vitals for the past 24 hrs:  BP Temp Temp src Pulse Resp SpO2  09/16/21 0701 (!) 110/59 -- -- 68 -- --  09/16/21 0601 101/64 98.4 F (36.9 C) Oral (!) 59 18 --  09/16/21 0501 110/67 -- -- 63 16 --  09/16/21 0401 (!) 107/59 -- -- 63 -- --  09/16/21 0301 (!) 99/57 -- -- 63 20 --  09/16/21 0201 (!) 101/53 -- -- 70 17 --  09/16/21 0100 118/67 -- -- 71 18 --  09/16/21 0034 105/60 -- -- 72 -- --  09/16/21 0019 127/65 98.3 F (36.8 C) Oral 66 17 98 %  09/15/21 2308 (!) 141/74 98.5 F (36.9 C) Oral 70 18 98 %  09/15/21 2156 (!) 150/78 98.5 F (36.9 C) Oral 65 16 100 %  09/15/21 2130 (!) 146/77 98.6 F (37 C) Oral 66 -- --  09/15/21 2100 (!) 147/88 -- -- 75 -- --  09/15/21 2034 (!) 146/91 -- -- 73 -- --  09/15/21 2030 (!) 154/88 -- -- 75 -- --  09/15/21 2015 (!) 149/83 -- -- 79 -- --  09/15/21 2000 (!) 142/84 -- -- 82 -- --  09/15/21 1955 -- 98.3 F (36.8 C) Oral -- -- --  09/15/21 1900 135/86 -- -- 98 -- --  09/15/21 1730 132/86 98.6 F (37 C) -- (!) 121 -- --  09/15/21 1630 138/76 -- -- 98 -- --  09/15/21 1611 -- (!) 100.7 F (38.2 C) Oral -- 18 --  09/15/21 1530 125/85 -- -- (!) 111 -- --  09/15/21 1500 136/90 -- -- (!) 109 -- --  09/15/21 1430 131/81 99.9 F (37.7 C) -- (!) 111 -- --  09/15/21 1400 123/76 -- -- (!) 106 -- --  09/15/21 1245 -- 98.4 F (36.9 C) Oral -- -- --  09/15/21 1230 (!) 134/95 -- -- 90 -- --  09/15/21 1200 131/86 -- -- 84 -- --  09/15/21 1130 130/84 -- -- 81 -- --  09/15/21 1100 127/86 --  -- 77 -- --  09/15/21 1000 109/63 -- -- 80 -- --  09/15/21 0945 -- 98.3 F (36.8 C) Oral -- -- --  09/15/21 0930 (!) 115/59 -- -- 79 -- --  09/15/21 0900 115/78 -- -- 79 -- --  09/15/21 0830 136/85 -- -- 79 -- --   Good UOP since delivery.  Gen: NAD HENT: Normocephalic, atraumatic Lungs: Normal respiratory effort Heart: Regular rate noted Abdomen: NT,gravid fundus, soft Cervix: Deferred Ext: 2+ DTRs, mild BLE edema, no cyanosis, negative Homan's sign  Labs: Results for orders placed or performed during the hospital encounter of 09/14/21 (from the past 24 hour(s))  Protein / creatinine ratio, urine     Status: Abnormal   Collection Time: 09/15/21  8:34 PM  Result Value Ref Range   Creatinine, Urine 23.42 mg/dL   Total Protein, Urine 33 mg/dL   Protein Creatinine Ratio 1.41 (H) 0.00 - 0.15 mg/mg[Cre]  CBC     Status: Abnormal   Collection Time: 09/15/21  9:18 PM  Result Value Ref Range   WBC 24.2 (  H) 4.0 - 10.5 K/uL   RBC 4.10 3.87 - 5.11 MIL/uL   Hemoglobin 12.5 12.0 - 15.0 g/dL   HCT 16.1 09.6 - 04.5 %   MCV 91.7 80.0 - 100.0 fL   MCH 30.5 26.0 - 34.0 pg   MCHC 33.2 30.0 - 36.0 g/dL   RDW 40.9 (H) 81.1 - 91.4 %   Platelets 182 150 - 400 K/uL   nRBC 0.0 0.0 - 0.2 %  Comprehensive metabolic panel     Status: Abnormal   Collection Time: 09/15/21  9:18 PM  Result Value Ref Range   Sodium 133 (L) 135 - 145 mmol/L   Potassium 3.5 3.5 - 5.1 mmol/L   Chloride 102 98 - 111 mmol/L   CO2 19 (L) 22 - 32 mmol/L   Glucose, Bld 101 (H) 70 - 99 mg/dL   BUN 10 6 - 20 mg/dL   Creatinine, Ser 7.82 (H) 0.44 - 1.00 mg/dL   Calcium 8.5 (L) 8.9 - 10.3 mg/dL   Total Protein 6.0 (L) 6.5 - 8.1 g/dL   Albumin 1.9 (L) 3.5 - 5.0 g/dL   AST 37 15 - 41 U/L   ALT 10 0 - 44 U/L   Alkaline Phosphatase 130 (H) 38 - 126 U/L   Total Bilirubin 1.3 (H) 0.3 - 1.2 mg/dL   GFR, Estimated 45 (L) >60 mL/min   Anion gap 12 5 - 15  CBC     Status: Abnormal   Collection Time: 09/16/21  4:57 AM  Result  Value Ref Range   WBC 30.7 (H) 4.0 - 10.5 K/uL   RBC 3.89 3.87 - 5.11 MIL/uL   Hemoglobin 11.9 (L) 12.0 - 15.0 g/dL   HCT 95.6 21.3 - 08.6 %   MCV 92.5 80.0 - 100.0 fL   MCH 30.6 26.0 - 34.0 pg   MCHC 33.1 30.0 - 36.0 g/dL   RDW 57.8 (H) 46.9 - 62.9 %   Platelets 177 150 - 400 K/uL   nRBC 0.0 0.0 - 0.2 %  Comprehensive metabolic panel     Status: Abnormal   Collection Time: 09/16/21  4:57 AM  Result Value Ref Range   Sodium 134 (L) 135 - 145 mmol/L   Potassium 3.5 3.5 - 5.1 mmol/L   Chloride 104 98 - 111 mmol/L   CO2 22 22 - 32 mmol/L   Glucose, Bld 108 (H) 70 - 99 mg/dL   BUN 10 6 - 20 mg/dL   Creatinine, Ser 5.28 (H) 0.44 - 1.00 mg/dL   Calcium 8.5 (L) 8.9 - 10.3 mg/dL   Total Protein 5.4 (L) 6.5 - 8.1 g/dL   Albumin 1.7 (L) 3.5 - 5.0 g/dL   AST 34 15 - 41 U/L   ALT 11 0 - 44 U/L   Alkaline Phosphatase 108 38 - 126 U/L   Total Bilirubin 0.9 0.3 - 1.2 mg/dL   GFR, Estimated >41 >32 mL/min   Anion gap 8 5 - 15  Magnesium     Status: Abnormal   Collection Time: 09/16/21  4:57 AM  Result Value Ref Range   Magnesium 3.8 (H) 1.7 - 2.4 mg/dL    Assessment & Plan:  20 y.o. G1P1001 PPD#1 s/p VAVD at [redacted]w[redacted]d complicated by chorioamnionitis and postpartum severe preeclampsia. -  Continue Lasix 20 mg po bid as ordered, but ordered Nifedipine held for now.  Will add Nifedipine XL 30 mg back if needed later. Continue Magnesium at 1g/hr for prescribed 24 hours - Improved Cr this  morning, recheck tomorrow morning. - NSAIDs discontinued for now. - Zosyn ordered for 24 hours, will be finished today for chorioamnionitis/endometritis prophylaxis.  - Breast and bottle feeding, desires condoms for contraception Continue routine postpartum care and close observation.   Jaynie Collins, MD, FACOG Obstetrician & Gynecologist, [facility] for [employer], [facility]

## 2021-09-17 LAB — COMPREHENSIVE METABOLIC PANEL
ALT: 14 U/L (ref 0–44)
AST: 37 U/L (ref 15–41)
Albumin: 1.9 g/dL — ABNORMAL LOW (ref 3.5–5.0)
Alkaline Phosphatase: 149 U/L — ABNORMAL HIGH (ref 38–126)
Anion gap: 9 (ref 5–15)
BUN: 8 mg/dL (ref 6–20)
CO2: 29 mmol/L (ref 22–32)
Calcium: 8.8 mg/dL — ABNORMAL LOW (ref 8.9–10.3)
Chloride: 98 mmol/L (ref 98–111)
Creatinine, Ser: 0.89 mg/dL (ref 0.44–1.00)
GFR, Estimated: >60 mL/min (ref >60)
Glucose, Bld: 67 mg/dL — ABNORMAL LOW (ref 70–99)
Potassium: 3.4 mmol/L — ABNORMAL LOW (ref 3.5–5.1)
Sodium: 136 mmol/L (ref 135–145)
Total Bilirubin: 0.5 mg/dL (ref 0.3–1.2)
Total Protein: 5.8 g/dL — ABNORMAL LOW (ref 6.5–8.1)

## 2021-09-17 MED ORDER — IBUPROFEN 600 MG PO TABS: 600.0000 mg | ORAL_TABLET | Freq: Four times a day (QID) | ORAL | 1 refills | Status: AC | PRN

## 2021-09-17 MED ORDER — FUROSEMIDE 20 MG PO TABS: 20.0000 mg | ORAL_TABLET | Freq: Two times a day (BID) | ORAL | 0 refills | Status: DC

## 2021-09-17 NOTE — Plan of Care (Signed)
Patient to be discharged with printed instructions. Maizee Reinhold L Mykala Mccready, RN  

## 2021-09-19 LAB — SURGICAL PATHOLOGY

## 2021-09-29 ENCOUNTER — Encounter: Payer: Self-pay | Admitting: Family Medicine

## 2021-09-29 ENCOUNTER — Telehealth (HOSPITAL_COMMUNITY): Payer: Self-pay | Admitting: *Deleted

## 2021-09-29 NOTE — Telephone Encounter (Signed)
Attempted hospital discharge follow-up call. No answer received. Deforest Hoyles, RN, 09/29/21, [phone]

## 2021-10-26 ENCOUNTER — Ambulatory Visit: Payer: Medicaid Other | Admitting: Nurse Practitioner

## 2021-11-01 ENCOUNTER — Ambulatory Visit: Payer: Medicaid Other | Admitting: Obstetrics and Gynecology

## 2021-11-01 ENCOUNTER — Encounter: Payer: Self-pay | Admitting: Obstetrics and Gynecology

## 2021-11-02 NOTE — Progress Notes (Addendum)
Patient did not keep her postpartum appointment for 11/01/2021.  Cornelia Copa MD Attending Center for [employer] [facility])

## 2021-11-11 ENCOUNTER — Telehealth: Payer: Self-pay | Admitting: General Practice

## 2021-11-11 NOTE — Telephone Encounter (Signed)
Patient called and left message requesting to reschedule her postpartum appt.

## 2021-11-29 ENCOUNTER — Ambulatory Visit: Payer: Medicaid Other

## 2022-01-23 ENCOUNTER — Other Ambulatory Visit (HOSPITAL_COMMUNITY)
Admission: RE | Admit: 2022-01-23 | Discharge: 2022-01-23 | Disposition: A | Discharge status: Home / Self Care | Payer: Medicaid Other | Source: Ambulatory Visit | Attending: Obstetrics and Gynecology | Admitting: Obstetrics and Gynecology

## 2022-01-23 ENCOUNTER — Ambulatory Visit (INDEPENDENT_AMBULATORY_CARE_PROVIDER_SITE_OTHER): Payer: Medicaid Other | Admitting: Obstetrics and Gynecology

## 2022-01-23 ENCOUNTER — Encounter: Payer: Self-pay | Admitting: Obstetrics and Gynecology

## 2022-01-23 VITALS — Wt 117.3 lb

## 2022-01-23 DIAGNOSIS — Z113 Encounter for screening for infections with a predominantly sexual mode of transmission: Secondary | ICD-10-CM | POA: Diagnosis not present

## 2022-01-23 DIAGNOSIS — Z202 Contact with and (suspected) exposure to infections with a predominantly sexual mode of transmission: Secondary | ICD-10-CM

## 2022-01-23 DIAGNOSIS — R87629 Unspecified abnormal cytological findings in specimens from vagina: Secondary | ICD-10-CM

## 2022-01-23 DIAGNOSIS — Z01419 Encounter for gynecological examination (general) (routine) without abnormal findings: Secondary | ICD-10-CM

## 2022-01-23 DIAGNOSIS — N938 Other specified abnormal uterine and vaginal bleeding: Secondary | ICD-10-CM | POA: Diagnosis not present

## 2022-01-23 NOTE — Patient Instructions (Signed)

## 2022-01-23 NOTE — Progress Notes (Signed)
Nichole Bauer is a 21 y.o. G30P1001 female here for a routine annual gynecologic exam.  Current complaints: Desires STD testing. First cycle was 01/12/22 since delivery in December.   Not sexual active.  ?Gynecologic History ?Patient's last menstrual period was 01/12/2022 (exact date). ?Contraception: abstinence ?Last Pap: 7/22. Results were: abnormal ASCUS, HPV negative ?Last mammogram: NA.  ? ?Obstetric History ?OB History  ?Gravida Para Term Preterm AB Living  ?1 1 1  0 0 1  ?SAB IAB Ectopic Multiple Live Births  ?0 0 0 0 1  ?  ?# Outcome Date GA Lbr Len/2nd Weight Sex Delivery Anes PTL Lv  ?1 Term 09/15/21 [redacted]w[redacted]d 33:58 / 02:59 7 lb 13.6 oz (3.56 kg) M Vag-Vacuum EPI  LIV  ? ? ?Past Medical History:  ?Diagnosis Date  ? Medical history non-contributory   ? Shoulder dystocia during labor and delivery 09/15/2021  ? ? ?Past Surgical History:  ?Procedure Laterality Date  ? NO PAST SURGERIES    ? ? ?Current Outpatient Medications on File Prior to Visit  ?Medication Sig Dispense Refill  ? ferrous sulfate 325 (65 FE) MG tablet Take 1 tablet (325 mg total) by mouth every other day. (Patient not taking: Reported on 01/23/2022) 30 tablet 3  ? ibuprofen (ADVIL) 600 MG tablet Take 1 tablet (600 mg total) by mouth every 6 (six) hours as needed. (Patient not taking: Reported on 01/23/2022) 30 tablet 1  ? Prenatal Vit-Fe Fumarate-FA (PREPLUS) 27-1 MG TABS Take 1 tablet by mouth daily. (Patient not taking: Reported on 01/23/2022) 30 tablet 13  ? ?No current facility-administered medications on file prior to visit.  ? ? ?No Known Allergies ? ?Social History  ? ?Socioeconomic History  ? Marital status: Single  ?  Spouse name: Not on file  ? Number of children: Not on file  ? Years of education: Not on file  ? Highest education level: Not on file  ?Occupational History  ? Not on file  ?Tobacco Use  ? Smoking status: Never  ? Smokeless tobacco: Never  ?Vaping Use  ? Vaping Use: Never used  ?Substance and Sexual Activity  ? Alcohol use:  Not Currently  ? Drug use: Never  ? Sexual activity: Yes  ?  Birth control/protection: Condom  ?Other Topics Concern  ? Not on file  ?Social History Narrative  ? Not on file  ? ?Social Determinants of Health  ? ?Financial Resource Strain: Not on file  ?Food Insecurity: No Food Insecurity  ? Worried About 01/25/2022 in the Last Year: Never true  ? Ran Out of Food in the Last Year: Never true  ?Transportation Needs: No Transportation Needs  ? Lack of Transportation (Medical): No  ? Lack of Transportation (Non-Medical): No  ?Physical Activity: Not on file  ?Stress: Not on file  ?Social Connections: Not on file  ?Intimate Partner Violence: Not on file  ? ? ?History reviewed. No pertinent family history. ? ?The following portions of the patient's history were reviewed and updated as appropriate: allergies, current medications, past family history, past medical history, past social history, past surgical history and problem list. ? ?Review of Systems ?Pertinent items noted in HPI and remainder of comprehensive ROS otherwise negative. ?  ?Objective:  ?Wt 117 lb 4.8 oz (53.2 kg)   LMP 01/12/2022 (Exact Date)   BMI 24.52 kg/m?  ?CONSTITUTIONAL: Well-developed, well-nourished female in no acute distress.  ?HENT:  Normocephalic, atraumatic, External right and left ear normal. Oropharynx is clear and moist ?EYES: Conjunctivae and  EOM are normal. Pupils are equal, round, and reactive to light. No scleral icterus.  ?NECK: Normal range of motion, supple, no masses.  Normal thyroid.  ?SKIN: Skin is warm and dry. No rash noted. Not diaphoretic. No erythema. No pallor. ?NEUROLGIC: Alert and oriented to person, place, and time. Normal reflexes, muscle tone coordination. No cranial nerve deficit noted. ?PSYCHIATRIC: Normal mood and affect. Normal behavior. Normal judgment and thought content. ?CARDIOVASCULAR: Normal heart rate noted, regular rhythm ?RESPIRATORY: Clear to auscultation bilaterally. Effort and breath sounds  normal, no problems with respiration noted. ?BREASTS: Deferred ?ABDOMEN: Soft, normal bowel sounds, no distention noted.  No tenderness, rebound or guarding.  ?PELVIC: Deferred ?MUSCULOSKELETAL: Normal range of motion. No tenderness.  No cyanosis, clubbing, or edema.  2+ distal pulses. ? ?UPT negative ?Assessment:  ?Annual gynecologic examination without pap smear ?STD testing ?DUB ?Abnormal pap smear ?Plan:  ?STD testing as per pt requests. Will follow cycles for now. Declines contraception.  ?F/U in Aug for repeat pap smear ?Routine preventative health maintenance measures emphasized. ?Please refer to After Visit Summary for other counseling recommendations.  ? ? ?Hermina Staggers, MD, FACOG ?Attending Obstetrician & Gynecologist ?Center for [employer], [facility]  ?

## 2022-01-24 LAB — CERVICOVAGINAL ANCILLARY ONLY
Bacterial Vaginitis (gardnerella): POSITIVE — AB
Candida Glabrata: NEGATIVE
Candida Vaginitis: NEGATIVE
Chlamydia: NEGATIVE
Comment: NEGATIVE
Comment: NEGATIVE
Comment: NEGATIVE
Comment: NEGATIVE
Comment: NEGATIVE
Comment: NORMAL
Neisseria Gonorrhea: NEGATIVE
Trichomonas: NEGATIVE

## 2022-01-24 LAB — HIV ANTIBODY (ROUTINE TESTING W REFLEX): HIV Screen 4th Generation wRfx: NONREACTIVE

## 2022-01-24 LAB — RPR: RPR Ser Ql: NONREACTIVE

## 2022-01-24 LAB — HEPATITIS C ANTIBODY: Hep C Virus Ab: NONREACTIVE

## 2022-01-24 LAB — HEPATITIS B SURFACE ANTIGEN: Hepatitis B Surface Ag: NEGATIVE

## 2022-01-25 ENCOUNTER — Telehealth: Payer: Self-pay

## 2022-01-25 MED ORDER — METRONIDAZOLE 500 MG PO TABS: 500.0000 mg | ORAL_TABLET | Freq: Two times a day (BID) | ORAL | 0 refills | Status: AC

## 2022-01-25 NOTE — Telephone Encounter (Addendum)
-----   Message from Chancy Milroy, MD sent at 01/25/2022  2:13 PM EDT ----- ?Please treat pt's BV per protocol. ?Pt is aware. ? ?Thanks ?Legrand Como ? ?Called pt unable to leave message due to phone not being in service.  MyChart message sent.  ? Mel Almond, RN  ?01/25/22 ?
